# Patient Record
Sex: Female | Born: 1975
Health system: Southern US, Community
[De-identification: ages and names within clinical notes are randomized; demographics above are authoritative.]

## PROBLEM LIST (undated history)

## (undated) DIAGNOSIS — K469 Unspecified abdominal hernia without obstruction or gangrene: Secondary | ICD-10-CM

## (undated) DIAGNOSIS — I10 Essential (primary) hypertension: Secondary | ICD-10-CM

## (undated) HISTORY — PX: HERNIA REPAIR: SHX51

---

## 2000-12-04 ENCOUNTER — Encounter: Payer: Self-pay | Admitting: *Deleted

## 2000-12-04 ENCOUNTER — Ambulatory Visit (HOSPITAL_COMMUNITY): Admission: RE | Admit: 2000-12-04 | Discharge: 2000-12-04 | Payer: Self-pay | Admitting: *Deleted

## 2001-05-16 ENCOUNTER — Ambulatory Visit (HOSPITAL_COMMUNITY): Admission: RE | Admit: 2001-05-16 | Discharge: 2001-05-16 | Payer: Self-pay | Admitting: *Deleted

## 2001-05-16 ENCOUNTER — Encounter: Payer: Self-pay | Admitting: *Deleted

## 2002-06-04 ENCOUNTER — Other Ambulatory Visit: Admission: RE | Admit: 2002-06-04 | Discharge: 2002-06-04 | Payer: Self-pay | Admitting: Family Medicine

## 2003-10-27 ENCOUNTER — Ambulatory Visit: Payer: Self-pay | Admitting: Family Medicine

## 2004-10-17 ENCOUNTER — Ambulatory Visit: Payer: Self-pay | Admitting: Family Medicine

## 2005-04-17 ENCOUNTER — Ambulatory Visit: Payer: Self-pay | Admitting: Family Medicine

## 2005-06-07 ENCOUNTER — Emergency Department (HOSPITAL_COMMUNITY): Admission: EM | Admit: 2005-06-07 | Discharge: 2005-06-07 | Payer: Self-pay | Admitting: *Deleted

## 2005-10-09 ENCOUNTER — Encounter: Admission: RE | Admit: 2005-10-09 | Discharge: 2005-11-21 | Payer: Self-pay | Admitting: Obstetrics and Gynecology

## 2005-12-05 ENCOUNTER — Inpatient Hospital Stay (HOSPITAL_COMMUNITY): Admission: AD | Admit: 2005-12-05 | Discharge: 2005-12-08 | Payer: Self-pay | Admitting: *Deleted

## 2006-10-15 DIAGNOSIS — Z9889 Other specified postprocedural states: Secondary | ICD-10-CM

## 2006-10-15 DIAGNOSIS — A6000 Herpesviral infection of urogenital system, unspecified: Secondary | ICD-10-CM | POA: Insufficient documentation

## 2006-11-21 ENCOUNTER — Emergency Department (HOSPITAL_COMMUNITY): Admission: EM | Admit: 2006-11-21 | Discharge: 2006-11-21 | Payer: Self-pay | Admitting: Family Medicine

## 2007-02-17 ENCOUNTER — Emergency Department (HOSPITAL_COMMUNITY): Admission: EM | Admit: 2007-02-17 | Discharge: 2007-02-17 | Payer: Self-pay | Admitting: Emergency Medicine

## 2007-11-27 ENCOUNTER — Encounter (INDEPENDENT_AMBULATORY_CARE_PROVIDER_SITE_OTHER): Payer: Self-pay | Admitting: *Deleted

## 2008-04-23 ENCOUNTER — Emergency Department (HOSPITAL_COMMUNITY): Admission: EM | Admit: 2008-04-23 | Discharge: 2008-04-24 | Payer: Self-pay | Admitting: Emergency Medicine

## 2008-10-08 ENCOUNTER — Telehealth (INDEPENDENT_AMBULATORY_CARE_PROVIDER_SITE_OTHER): Payer: Self-pay | Admitting: *Deleted

## 2009-10-27 ENCOUNTER — Ambulatory Visit: Payer: Self-pay | Admitting: Physician Assistant

## 2009-10-27 DIAGNOSIS — K429 Umbilical hernia without obstruction or gangrene: Secondary | ICD-10-CM | POA: Insufficient documentation

## 2009-11-24 ENCOUNTER — Ambulatory Visit: Payer: Self-pay | Admitting: Nurse Practitioner

## 2009-11-24 DIAGNOSIS — R350 Frequency of micturition: Secondary | ICD-10-CM

## 2009-11-24 LAB — CONVERTED CEMR LAB
Bilirubin Urine: NEGATIVE
Glucose, Urine, Semiquant: NEGATIVE
Ketones, urine, test strip: NEGATIVE
Nitrite: NEGATIVE
Protein, U semiquant: NEGATIVE
Specific Gravity, Urine: 1.02
Urobilinogen, UA: 0.2
WBC Urine, dipstick: NEGATIVE
pH: 7

## 2010-01-12 ENCOUNTER — Ambulatory Visit: Payer: Self-pay | Admitting: Nurse Practitioner

## 2010-03-01 NOTE — Assessment & Plan Note (Signed)
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 Is Patient Diabetic? No Pain Assessment Patient in pain? no       Does patient need assistance? Functional Status Self care Ambulation Normal   CC:  pt is here knot on naval....  History of Present Illness: Re-estab.  Previously seeing Dr. Barbaraann Barthel.  Has followed with Dr. Su Hilt at GYN clinic at [facility] over last 3 years.  Notes mass around navel over last several mos.  Has 2 children.  Thought it was from carrying youngest who is 3.  She notes mass can be pushed back in.  Does have some tenderness.  She is passing flatus.  Normal BMs.  No nausea or vomiting.    Habits & Providers  Alcohol-Tobacco-Diet     Tobacco Status: current  Exercise-Depression-Behavior     Drug Use: no  Current Medications (verified): 1)  None  Allergies (verified): No Known Drug Allergies  Past History:  Past Medical History: Current Problems:  HERPES, GENITAL NOS (ICD-054.10) ABORTION, BY DILATION AND EVACUATION, HX OF (ICD-V15.2) PREGNANCY, NORMAL (ICD-V22.2) h/o gestational diabetes  Past Surgical History: Reviewed history from 10/15/2006 and no changes required. 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 GU:  Denies dysuria.  Physical Exam  General:  alert, well-developed, and well-nourished.   Head:  normocephalic and atraumatic.     Neck:  supple.   Lungs:  normal breath sounds.   Heart:  normal rate and regular rhythm.   Abdomen:  soft, normal bowel sounds, no hepatomegaly, and no splenomegaly.   small periumbilical hernia noted; easily reduced; mod painful with palpation  Neurologic:  alert & oriented X3 and cranial nerves II-XII intact.   Psych:  normally interactive.     Impression & Recommendations:  Problem # 1:  PERIUMBILICAL HERNIA (ICD-553.1) offered referral to sugeon she prefers to hold off for now will monitor and let us know if she changes mind discussed signs to look out for with incarcerated hernias  Problem # 2:  Preventive Health Care (ICD-V70.0) schedule cpp  Patient Instructions: 1)  Schedule CPP in the next 2-3 months. 2)  Call if your hernia feels worse or you decide you want to see the surgeon.  Appended Document: Periumbilical hernia    Clinical Lists Changes  Orders: Added new Service order of New Patient Level III [id number]) - Signed

## 2010-03-01 NOTE — Assessment & Plan Note (Signed)
Summary:  UTI  ///  KT  Nurse Visit   Vital Signs:  Patient profile:   35 year old female Temp:     97.8 degrees F Pulse rate:   92 / minute Pulse rhythm:   regular Resp:     20 per minute BP sitting:   116 / 70  (right arm) Cuff size:   regular  Vitals Entered By: Dutch Quint RN (November 24, 2009 2:06 PM)  Patient Instructions: 1)  Reviewed with Melissa Moyer 2)  We will call you with the results of your urine culture. 3)  Don't use scented toilet paper or lotions, washes.  Don't douche.   4)  Use cotton panties, or at least cotton-lined panties and stockings. 5)  Drink plenty of water. 6)  When performing personal hygiene, make sure you wash/wipe from front to back. 7)  Call if you have any questions or if anything changes.   Impression & Recommendations:  Problem # 1:  URINARY FREQUENCY (ICD-788.41) c/o urinary discomfort, frequency, hesitancy Urine culture sent No tx at this time  Orders: UA Dipstick w/o Micro (automated)  (81003) T-Culture, Urine ([id number])  CC: Thinks she has a UTI Is Patient Diabetic? No Pain Assessment Patient in pain? yes     Location: right flank Intensity: 6 Type: cramping   CC:  Thinks she has a UTI.  History of Present Illness: Having lower back/right flank pain, thinks she has a UTI, also discomfort when she urinates.  Denies frequency, but having urgency, urinary retention.  Denies odor, no vaginal discharge, hematuria. Denies using scented products, new cleansing products. Has changed laundry detergents.   Review of Systems GU:  Complains of dysuria, nocturia, and urinary hesitancy.   Allergies: No Known Drug Allergies Laboratory Results   Urine Tests  Date/Time Received: November 24, 2009 2:26 PM   Routine Urinalysis   Color: yellow Glucose: negative   (Normal Range: Negative) Bilirubin: negative   (Normal Range: Negative) Ketone: negative   (Normal Range: Negative) Spec. Gravity: 1.020   (Normal Range:  1.003-1.035) Blood: trace-intact   (Normal Range: Negative) pH: 7.0   (Normal Range: 5.0-8.0) Protein: negative   (Normal Range: Negative) Urobilinogen: 0.2   (Normal Range: 0-1) Nitrite: negative   (Normal Range: Negative) Leukocyte Esterace: negative   (Normal Range: Negative)       Orders Added: 1)  Est. Patient Level I [40102] 2)  UA Dipstick w/o Micro (automated)  [81003] 3)  T-Culture, Urine [[id number]]  Prevention & Chronic Care Immunizations   Influenza vaccine: Not documented   Influenza vaccine deferral: Refused  (11/24/2009)    Tetanus booster: Not documented    Pneumococcal vaccine: Not documented  Other Screening   Pap smear: Not documented   Smoking status: current  (10/27/2009)  Lipids   Total Cholesterol: Not documented   LDL: Not documented   LDL Direct: Not documented   HDL: Not documented   Triglycerides: Not documented

## 2010-04-22 ENCOUNTER — Encounter: Payer: Self-pay | Admitting: Nurse Practitioner

## 2010-04-22 ENCOUNTER — Other Ambulatory Visit: Payer: Self-pay | Admitting: Nurse Practitioner

## 2010-04-22 ENCOUNTER — Other Ambulatory Visit (HOSPITAL_COMMUNITY)
Admission: RE | Admit: 2010-04-22 | Discharge: 2010-04-22 | Disposition: A | Discharge status: Home / Self Care | Payer: Medicaid Other | Source: Ambulatory Visit | Attending: Internal Medicine | Admitting: Internal Medicine

## 2010-04-22 ENCOUNTER — Encounter (INDEPENDENT_AMBULATORY_CARE_PROVIDER_SITE_OTHER): Payer: Self-pay | Admitting: Nurse Practitioner

## 2010-04-22 DIAGNOSIS — N898 Other specified noninflammatory disorders of vagina: Secondary | ICD-10-CM | POA: Insufficient documentation

## 2010-04-22 DIAGNOSIS — Z01419 Encounter for gynecological examination (general) (routine) without abnormal findings: Secondary | ICD-10-CM | POA: Insufficient documentation

## 2010-04-22 DIAGNOSIS — F17200 Nicotine dependence, unspecified, uncomplicated: Secondary | ICD-10-CM | POA: Insufficient documentation

## 2010-04-22 DIAGNOSIS — Z8742 Personal history of other diseases of the female genital tract: Secondary | ICD-10-CM | POA: Insufficient documentation

## 2010-04-22 DIAGNOSIS — R3129 Other microscopic hematuria: Secondary | ICD-10-CM | POA: Insufficient documentation

## 2010-04-22 DIAGNOSIS — A599 Trichomoniasis, unspecified: Secondary | ICD-10-CM | POA: Insufficient documentation

## 2010-04-22 DIAGNOSIS — D649 Anemia, unspecified: Secondary | ICD-10-CM | POA: Insufficient documentation

## 2010-04-22 LAB — CONVERTED CEMR LAB
ALT: 12 units/L (ref 0–35)
AST: 15 units/L (ref 0–37)
Albumin: 4.2 g/dL (ref 3.5–5.2)
Alkaline Phosphatase: 52 units/L (ref 39–117)
BUN: 14 mg/dL (ref 6–23)
Basophils Absolute: 0.1 10*3/uL (ref 0.0–0.1)
Basophils Relative: 1 % (ref 0–1)
Bilirubin Urine: NEGATIVE
CO2: 25 meq/L (ref 19–32)
Calcium: 9.4 mg/dL (ref 8.4–10.5)
Chlamydia, DNA Probe: NEGATIVE
Chloride: 105 meq/L (ref 96–112)
Creatinine, Ser: 0.79 mg/dL (ref 0.40–1.20)
Eosinophils Absolute: 0.3 10*3/uL (ref 0.0–0.7)
Eosinophils Relative: 3 % (ref 0–5)
GC Probe Amp, Genital: NEGATIVE
Glucose, Bld: 80 mg/dL (ref 70–99)
Glucose, Urine, Semiquant: NEGATIVE
HCT: 36.1 % (ref 36.0–46.0)
Hemoglobin: 12 g/dL (ref 12.0–15.0)
KOH Prep: NEGATIVE
Ketones, urine, test strip: NEGATIVE
Lymphocytes Relative: 34 % (ref 12–46)
Lymphs Abs: 3.1 10*3/uL (ref 0.7–4.0)
MCHC: 33.2 g/dL (ref 30.0–36.0)
MCV: 94.3 fL (ref 78.0–100.0)
Monocytes Absolute: 0.7 10*3/uL (ref 0.1–1.0)
Monocytes Relative: 7 % (ref 3–12)
Neutro Abs: 5 10*3/uL (ref 1.7–7.7)
Neutrophils Relative %: 55 % (ref 43–77)
Nitrite: NEGATIVE
Platelets: 272 10*3/uL (ref 150–400)
Potassium: 3.9 meq/L (ref 3.5–5.3)
Protein, U semiquant: NEGATIVE
RBC: 3.83 M/uL — ABNORMAL LOW (ref 3.87–5.11)
RDW: 15.8 % — ABNORMAL HIGH (ref 11.5–15.5)
Rapid HIV Screen: NEGATIVE
Sodium: 140 meq/L (ref 135–145)
Specific Gravity, Urine: 1.02
Total Bilirubin: 0.3 mg/dL (ref 0.3–1.2)
Total Protein: 6.9 g/dL (ref 6.0–8.3)
Urobilinogen, UA: 0.2
WBC Urine, dipstick: NEGATIVE
WBC: 9.2 10*3/uL (ref 4.0–10.5)
pH: 7

## 2010-04-22 LAB — CYTOLOGY - PAP

## 2010-04-25 ENCOUNTER — Encounter (INDEPENDENT_AMBULATORY_CARE_PROVIDER_SITE_OTHER): Payer: Self-pay | Admitting: Nurse Practitioner

## 2010-04-25 ENCOUNTER — Encounter (INDEPENDENT_AMBULATORY_CARE_PROVIDER_SITE_OTHER): Payer: Self-pay | Admitting: Internal Medicine

## 2010-04-25 ENCOUNTER — Telehealth (INDEPENDENT_AMBULATORY_CARE_PROVIDER_SITE_OTHER): Payer: Self-pay | Admitting: Nurse Practitioner

## 2010-04-27 ENCOUNTER — Encounter (INDEPENDENT_AMBULATORY_CARE_PROVIDER_SITE_OTHER): Payer: Self-pay | Admitting: Nurse Practitioner

## 2010-04-28 NOTE — Assessment & Plan Note (Signed)
Summary: Complete Physical Exam   Vital Signs:  Patient profile:   35 year old female Menstrual status:  regular LMP:     03/31/2010 Weight:      168.9 pounds BMI:     26.55 Temp:     98.5 degrees F oral Pulse rate:   90 / minute Pulse rhythm:   regular Resp:     20 per minute BP sitting:   113 / 74  (left arm) Cuff size:   regular  Vitals Entered By: Levon Hedger (April 22, 2010 2:23 PM)  Nutrition Counseling: Patient's BMI is greater than 25 and therefore counseled on weight management options. CC: Cpp...hernia beside navel Is Patient Diabetic? No Pain Assessment Patient in pain? no       Does patient need assistance? Functional Status Self care Ambulation Normal LMP (date): 03/31/2010 LMP - Character: normal    Menses interval (days): 26 Menstrual flow (days): 5 Menstrual Status regular Enter LMP: 03/31/2010   CC:  Cpp...hernia beside navel.  History of Present Illness:  Pt into the office for a CPE  PAP - done last 3 years ago No family history of cervical or ovarian Menses - monthly 2 children - natural births No current birth control  Mammogram - never had a mammogram No self breast checks at home no family history of breast cancer  Optho - No current glasses or contacts. previous eye exam and no corrective lenses needed  Dental - last dental appt was 1 month ago  tdap - last in 2009 (pt recall)  Habits & Providers  Alcohol-Tobacco-Diet     Alcohol drinks/day: <1     Tobacco Status: current     Tobacco Counseling: to quit use of tobacco products     Cigarette Packs/Day: 0.5  Exercise-Depression-Behavior     Does Patient Exercise: no     Have you felt down or hopeless? no     Have you felt little pleasure in things? no     Depression Counseling: not indicated; screening negative for depression     Drug Use: no  Medications Prior to Update: 1)  None  Allergies (verified): No Known Drug Allergies  Social History: Packs/Day:   0.5 Does Patient Exercise:  no  Review of Systems General:  Denies fever. Eyes:  Denies blurring. ENT:  Denies earache. CV:  Denies chest pain or discomfort and fatigue. Resp:  Denies cough. GI:  Denies abdominal pain, nausea, and vomiting. GU:  Complains of urinary frequency; denies discharge. MS:  Denies joint pain. Derm:  Denies dryness. Neuro:  Denies headaches. Psych:  Denies anxiety and depression.  Physical Exam  General:  alert.   Head:  normocephalic.   Eyes:  pupils round.   Ears:  ear piercing(s) noted.   Nose:  no nasal discharge.   Mouth:  tongue ring Neck:  supple.   Chest Wall:  no mass.   Breasts:  skin/areolae normal.   Lungs:  normal breath sounds.   Heart:  normal rate and regular rhythm.   Abdomen:  normal bowel sounds.  small periumbilical hernia - reducible  Rectal:  no external abnormalities.    Pelvic Exam  Vulva:      normal appearance.   Urethra and Bladder:      Urethra--normal.  Bladder--normal.   Vagina:      malodorus, frothy discharge.   Cervix:      midposition.   Uterus:      smooth.   Adnexa:  nontender bilaterally.      Impression & Recommendations:  Problem # 1:  ROUTINE GYNECOLOGICAL EXAMINATION (ICD-V72.31) PAP done  rec optho and dental Orders: KOH/ WET Mount [phone]) Pap Smear, Thin Prep ( Collection of) (J8119)  Problem # 2:  URINARY FREQUENCY (ICD-788.41)  Orders: UA Dipstick w/o Micro (manual) (14782) T- GC Chlamydia (95621)  Problem # 3:  TOBACCO ABUSE (ICD-305.1) advised cessation  Problem # 4:  PERIUMBILICAL HERNIA (ICD-553.1) advised pt to monitor very small defect now - will refer if symptoms get worse  Problem # 5:  TRICHOMONIASIS (ICD-131.9) handout given to pt advised that her partner needs treatment  Complete Medication List: 1)  Metronidazole 500 Mg Tabs (Metronidazole) .... 4 tablets by mouth x 1 dose  Other Orders: T-CBC w/Diff ([id number]) T-Culture, Urine  ([id number]) T-Comprehensive Metabolic Panel ([id number])  Patient Instructions: 1)  You have an infection - read handout 2)  Take medication as ordered to make sure infection is cured. 3)  Be sure your partner is also treated. 4)  Follow up as needed Prescriptions: METRONIDAZOLE 500 MG TABS (METRONIDAZOLE) 4 tablets by mouth x 1 dose  #4 x 0   Entered and Authorized by:   Lehman Prom FNP   Signed by:   Lehman Prom FNP on 04/22/2010   Method used:   Print then Give to Patient   RxID:   [id number]    Orders Added: 1)  UA Dipstick w/o Micro (manual) [81002] 2)  Est. Patient age 65-39 [73] 3)  KOH/ WET Irish Lack [id number] 4)  Pap Smear, Thin Prep ( Collection of) [Q0091] 5)  T- GC Chlamydia [32951] 6)  T-CBC w/Diff [[id number]] 7)  T-Culture, Urine [[id number]] 8)  T-Comprehensive Metabolic Panel [[id number]]    Laboratory Results   Urine Tests  Date/Time Received: April 22, 2010 2:39 PM   Routine Urinalysis   Color: yellow Appearance: Clear Glucose: negative   (Normal Range: Negative) Bilirubin: negative   (Normal Range: Negative) Ketone: negative   (Normal Range: Negative) Spec. Gravity: 1.020   (Normal Range: 1.003-1.035) Blood: moderate   (Normal Range: Negative) pH: 7.0   (Normal Range: 5.0-8.0) Protein: negative   (Normal Range: Negative) Urobilinogen: 0.2   (Normal Range: 0-1) Nitrite: negative   (Normal Range: Negative) Leukocyte Esterace: negative   (Normal Range: Negative)    Date/Time Received: April 22, 2010 3:40 PM   Wet Mount/KOH Source: vaginal  WBC/hpf: 1-5 Bacteria/hpf: rare Clue cells/hpf: none Yeast/hpf: none Trichomonas/hpf: few  Other Tests  Rapid HIV: negative    Laboratory Results   Urine Tests    Routine Urinalysis   Color: yellow Appearance: Clear Glucose: negative   (Normal Range: Negative) Bilirubin: negative   (Normal Range: Negative) Ketone: negative   (Normal Range: Negative) Spec. Gravity: 1.020    (Normal Range: 1.003-1.035) Blood: moderate   (Normal Range: Negative) pH: 7.0   (Normal Range: 5.0-8.0) Protein: negative   (Normal Range: Negative) Urobilinogen: 0.2   (Normal Range: 0-1) Nitrite: negative   (Normal Range: Negative) Leukocyte Esterace: negative   (Normal Range: Negative)      Wet Mount Wet Mount KOH: Negative  Other Tests  Rapid HIV: negative

## 2010-04-28 NOTE — Letter (Signed)
Summary: Handout Printed  Printed Handout:  - Trichomonas 

## 2010-05-03 NOTE — Progress Notes (Signed)
Summary: Surgery Referral update  Phone Note Call from Patient Call back at Home Phone [phone]   Reason for Call: Referral Summary of Call: PT WANTS HERNIA REFERRAL AFTER ALL.  PLS CALL WITH INFO. Initial call taken by: Ayesha Rumpf,  April 25, 2010 8:23 AM  Follow-up for Phone Call        reviewed with pt during exam that her periumbilical hernia is small and reducible. There is likely not going to be anything surgically needed at this time as area is reducible, nontender.  But that she should avoid straining and lifting. Pt does have medicaid - will order surgery referral as per her request but advise pt this is just for consult. Going to the consult does NOT mean that she is going to have surgery. Follow-up by: Lehman Prom FNP,  April 25, 2010 9:32 AM  Additional Follow-up for Phone Call Additional follow up Details #1::        Pt is sched for 05-10-10 @ 2:50pm with Dr.Byerly @ [facility]. Additional Follow-up by: Candi Leash,  April 28, 2010 12:26 PM

## 2010-05-03 NOTE — Letter (Signed)
Summary: SURGICAL REFERRAL  SURGICAL REFERRAL   Imported By: Arta Bruce 04/28/2010 15:12:20  _____________________________________________________________________  External Attachment:    Type:   Image     Comment:   External Document

## 2010-05-03 NOTE — Letter (Signed)
Summary: *HSN Results Follow up  [facility]  [address] [place], [place] [address]   Phone: [phone]  Fax: [phone]      04/25/2010   Melissa Moyer [address] [place], [place]  [address]   Dear  Ms. Melissa Moyer,                            ____S.Drinkard,FNP   ____D. Gore,FNP       ____B. McPherson,MD   ____V. Rankins,MD    ____E. Mulberry,MD    _X___N. Daphine Deutscher, FNP  ____D. Reche Dixon, MD    ____K. Philipp Deputy, MD    ____Other     This letter is to inform you that your recent test(s):  ___X____Pap Smear    ___X____Lab Test     _______X-ray    ___X____ is within acceptable limits  _______ requires a medication change  _______ requires a follow-up lab visit  _______ requires a follow-up visit with your provider   Comments: Labs done during recent office visit are normal.  Pap Smear results _____________________.       _________________________________________________________ If you have any questions, please contact our office [phone].                    Sincerely,   Lehman Prom FNP [facility]

## 2010-05-12 LAB — D-DIMER, QUANTITATIVE: D-Dimer, Quant: 0.34 ug/mL-FEU (ref 0.00–0.48)

## 2010-06-17 NOTE — H&P (Signed)
Melissa Moyer NO.:  [id number]   MEDICAL RECORD NO.:  [id number]          PATIENT TYPE:  INP   LOCATION:  9134                          FACILITY:  [facility]   PHYSICIAN:  Osborn Coho, M.D.   DATE OF BIRTH:  Apr 30, 1975   DATE OF ADMISSION:  12/05/2005  DATE OF DISCHARGE:                                HISTORY & PHYSICAL   Melissa Moyer is a 35 year old gravida 3, para 1-0-1-1 who was admitted at 38  weeks for induction of labor secondary to gestational diabetes, on insulin  and non-reactive NSTs with a favorable cervix.  The patient was evaluated by  Dr. Pennie Rushing in the office prior to admission today and was found to be 2-3  cm dilated, 70% effaced, and 0 station.  The patient with nonreactive NST at  the office today. The patient denies leakage of fluids, bleeding, or  contractions at the present time.  The patient reports that she has noted  some increased vaginal discharge with no itch or odor.  The patient reports  that her fetus has been moving normally.  The patient denies any HSV signs  and symptoms or prodromal signs and symptoms.  The patient reports her last  HSV outbreak was approximately 1 year ago.  The patient denies headache,  vision changes, or right upper quadrant pain.  The patient's pregnancy is  remarkable for:  1. Gestational diabetes on insulin.  2. History of HSV-2.  3. History of asthma.  4. Conceived on OCPs.   PRENATAL LABS:  Initial hemoglobin 11.7, hematocrit 33.6, platelets 269,000,  blood type A-positive.  Antibody screen is negative.  Sickle cell trait is  negative.  RPR nonreactive.  Rubella titer immune.  Hepatitis B surface  antigen negative.  HIV nonreactive.  Pap smear within normal limits.  Gonorrhea and Chlamydia cultures are negative x2.  Cystic fibrosis screening  was declined.  First trimester screen declined.  Quad screen declined.  Hemoglobin at 27 weeks 9.7.  One-hour Glucola at 27 weeks 138.  Three-hour  Glucola  results:  Fasting 107, one hour 188,  two hours 141, three hours  148.  Group B strep is negative.   HISTORY OF PRESENT PREGNANCY:  The patient entered care at 65 weeks'  gestation.  The patient's [facility], December 19, 2005, established by an  ultrasound at 13 weeks.  The patient's last menstrual period March 09, 2005.  The patient's pregnancy initially followed by the Melissa Moyer service at  [facility] but the patient transferred to MD service with the  diagnosis of gestational diabetes.  The patient declined first trimester  screen.  The patient underwent a repeat ultrasound at 18 weeks, revealed  normal anatomy, a cervix 3.69 cm, anterior placenta with no previa with  intermittent complaints of back pain which resolved spontaneously between 15  and 18 weeks.  One-hour Glucola was obtained at 27 weeks.  The patient was  started on b.i.d. iron supplements at 27 weeks secondary to hemoglobin of  9.7.  The patient was referred to [facility] for  diabetic education at 43  weeks.  At 31 weeks' the patient reported fasting  blood sugars 87-100 and 2-hour postprandial of 76-138.  Ultrasound was  repeated at 32 weeks secondary to size less than dates which revealed  estimated fetal weight at the 67th to [facility] with normal amniotic  fluid.  At 32 weeks fasting blood sugars were all less than 95 and 2-hour  postprandials 91-125.  Nonstress test at 32 and 6, was nonreactive and the  patient underwent biophysical profile which was 8 out of 10.  Amniotic fluid  index was normal at that time.  Fasting blood sugars 83-96 and 2-hour  postprandials 94-148.  At that time, the patient was counseled to increase  exercise and be compliant with diet; however, if not, if her blood sugars  were not decreased, then she would be started on insulin.  At 33 weeks and 6  days, fasting blood sugars 91-103 and 2-hour postprandials 81-140.  The  patient was started on 6 units of NPH  insulin at that time.  The patient  continues on biweekly NSTs.  The patient began Valtrex prophylaxis at 35  weeks' gestation.  The patient continues with biweekly NSTs until the day of  admission.   OB HISTORY:  Pregnancy #1, August 1992, spontaneous vaginal delivery, female  infant, 7 pounds 5-1/2 ounces at 33 weeks' gestation, 5-hour labor with no  complications.  Pregnancy #2, February 1997, spontaneous AB at 9 weeks, no  complications.  Pregnancy #3, present.   GYN HISTORY:  The patient reports the use of Ortho-Tri-Cyclen which she  discontinued in February 2007.  The patient with a history of abnormal Pap  in the past, however, none recent.  The patient with a history of HSV and  takes Valtrex on a daily basis prior to pregnancy.  The patient denies other  history of STDs.  The patient reports menarche at age 71 with regular  monthly menses.   PAST MEDICAL HISTORY:  History with asthma.  However, the patient has not  used any medications for the past 3-4 years.   PAST SURGICAL HISTORY:  Negative.  Hospitalized only for childbirth.   ALLERGIES:  No known drug allergies.   CURRENT MEDICATIONS:  1. Valtrex 500 mg p.o. daily.  2. Prenatal vitamins 1 tablet daily.   FAMILY HISTORY:  Mother with chronic hypertension, anemia, tobacco use.  Paternal grandfather CVA, colon cancer.   GENETIC HISTORY:  The patient's nephew with sickle cell trait, otherwise  negative.   SOCIAL HISTORY:  The patient is single.  Father of the baby, [name] is involved and supportive.  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  Father of the baby is an Economist and has an associate's  degree education.  The patient denies the use of tobacco, alcohol, or street  drugs at the present.  However, the patient with a history of tobacco use in the past.  The patient's domestic violence screen is negative.    REVIEW OF SYSTEMS:  __________ of one at-term pregnancy.   PHYSICAL EXAM:  The patient is afebrile.  Vital signs are stable.  HEENT:  Within normal limits.  LUNGS:  Clear.  HEART:  Regular rate and rhythm.  BREASTS:  Soft.  ABDOMEN:  Soft, gravid, and nontender.  Fetus vertex to Leopold's maneuvers.  CBG on admission 162 and the patient ate 2 hours and 45 minutes prior to  that value being obtained.  Fetal  heart rate in the 160s baseline with  accelerations noted to be present and variability present.  No decelerations  are noted.  Toco with irregular uterine contractions are noted.  Speculum  exam of the cervix reveals no HSV lesions vision on the external genitalia,  cervix, or vagina.  Sterile vaginal exam, per Dr. Su Hilt, 2-3 cm, 70%  effaced, -2 station, and attempted AROM which was unsuccessful.  EXTREMITIES:  Negative edema.  Negative Homans bilaterally.   ASSESSMENT:  1. Intrauterine pregnancy at 80 weeks' gestation.  2. Gestational diabetes, poorly controlled.  3. Nonreactive antepartal testing.  4. Induction of labor secondary to #2 and #3.   PLAN:  1. The patient to be admitted to birthing suite for a consult with Dr.      Su Hilt.  2. Routine MD orders.  3. The patient will be started on low-dose Pitocin per protocol.  The      patient will have CBGs obtained every 2 hours and epidural p.r.n.      Melissa Moyer, Melissa Moyer      Osborn Coho, M.D.  Electronically Signed    NOS/MEDQ  D:  12/05/2005  T:  12/06/2005  Job:  [id number]

## 2010-10-20 LAB — POCT URINALYSIS DIP (DEVICE)
Ketones, ur: NEGATIVE
Operator id: 247071
Protein, ur: 100 — AB
Urobilinogen, UA: 1
pH: 7

## 2010-11-09 LAB — POCT URINALYSIS DIP (DEVICE)
Ketones, ur: NEGATIVE
Operator id: 239071
Protein, ur: NEGATIVE
Specific Gravity, Urine: 1.02
Urobilinogen, UA: 0.2
pH: 7

## 2011-03-09 ENCOUNTER — Encounter (HOSPITAL_COMMUNITY): Payer: Self-pay | Admitting: Emergency Medicine

## 2011-03-09 ENCOUNTER — Emergency Department (INDEPENDENT_AMBULATORY_CARE_PROVIDER_SITE_OTHER)
Admission: EM | Admit: 2011-03-09 | Discharge: 2011-03-09 | Disposition: A | Discharge status: Home / Self Care | Payer: Medicaid Other | Source: Home / Self Care

## 2011-03-09 DIAGNOSIS — B86 Scabies: Secondary | ICD-10-CM

## 2011-03-09 MED ORDER — PERMETHRIN 5 % EX CREA: TOPICAL_CREAM | CUTANEOUS | Status: AC

## 2011-03-09 NOTE — ED Notes (Signed)
[facility] [address]

## 2011-03-09 NOTE — ED Notes (Signed)
Child being treated as well today

## 2011-03-09 NOTE — ED Provider Notes (Signed)
History     CSN: [id number]  Arrival date & time 03/09/11  0910   None     Chief Complaint  Patient presents with  . Rash    (Consider location/radiation/quality/duration/timing/severity/associated sxs/prior treatment) HPI Comments: Patient states that she and children have had an itchy rash for at least 4 months. She was been using otc products and Vitamin E. As some bumps go new ones come. She initially thought the rash may be due to laundry detergents or soaps.  Her nephew had the same rash and was diagnosed yesterday with scabies.    Past Medical History  Diagnosis Date  . Asthma     History reviewed. No pertinent past surgical history.  History reviewed. No pertinent family history.  History  Substance Use Topics  . Smoking status: Current Everyday Smoker  . Smokeless tobacco: Not on file  . Alcohol Use: No    OB History    Grav Para Term Preterm Abortions TAB SAB Ect Mult Living                  Review of Systems  Constitutional: Negative for fever and chills.  HENT: Negative for congestion and sneezing.   Respiratory: Negative for cough.     Allergies  Fish allergy  Home Medications   Current Outpatient Rx  Name Route Sig Dispense Refill  . OVER THE COUNTER MEDICATION  Vitamin e blue star ointment    . PERMETHRIN 5 % EX CREA  Apply at bedtime for 8-12 hrs as directed 60 g 0    BP 107/70  Pulse 85  Temp(Src) 98.5 F (36.9 C) (Oral)  Resp 20  SpO2 100%  LMP 03/03/2011  Physical Exam  Nursing note and vitals reviewed. Constitutional: She appears well-developed and well-nourished. No distress.  HENT:  Head: Normocephalic and atraumatic.  Musculoskeletal: Normal range of motion. She exhibits no edema and no tenderness.  Skin: Skin is warm and dry. Rash noted.  Psychiatric: She has a normal mood and affect.    ED Course  Procedures (including critical care time)  Labs Reviewed - No data to display No results found.   1. Scabies        MDM          Melody Comas, PA 03/09/11 1115

## 2011-03-09 NOTE — ED Notes (Signed)
Patient reports concern for scabies.  Reports a nephew had the same rash and diagnosed with scabies.  Reports 4 month history of the same.  Patient reports bumps "come and go " and itch

## 2011-03-11 NOTE — ED Provider Notes (Signed)
Medical screening examination/treatment/procedure(s) were performed by non-physician practitioner and as supervising physician I was immediately available for consultation/collaboration.  Luiz Blare MD   Luiz Blare, MD 03/11/11 2133

## 2012-04-24 ENCOUNTER — Emergency Department (HOSPITAL_COMMUNITY)
Admission: EM | Admit: 2012-04-24 | Discharge: 2012-04-24 | Disposition: A | Discharge status: Home / Self Care | Payer: Medicaid Other | Source: Home / Self Care

## 2012-08-20 ENCOUNTER — Encounter (HOSPITAL_COMMUNITY): Payer: Self-pay | Admitting: Emergency Medicine

## 2012-08-20 ENCOUNTER — Emergency Department (INDEPENDENT_AMBULATORY_CARE_PROVIDER_SITE_OTHER)
Admission: EM | Admit: 2012-08-20 | Discharge: 2012-08-20 | Disposition: A | Discharge status: Home / Self Care | Payer: Medicaid Other | Source: Home / Self Care | Attending: Emergency Medicine | Admitting: Emergency Medicine

## 2012-08-20 DIAGNOSIS — R319 Hematuria, unspecified: Secondary | ICD-10-CM

## 2012-08-20 DIAGNOSIS — R1013 Epigastric pain: Secondary | ICD-10-CM

## 2012-08-20 HISTORY — DX: Unspecified abdominal hernia without obstruction or gangrene: K46.9

## 2012-08-20 LAB — COMPREHENSIVE METABOLIC PANEL
AST: 15 U/L (ref 0–37)
Albumin: 3.7 g/dL (ref 3.5–5.2)
BUN: 9 mg/dL (ref 6–23)
Calcium: 9.6 mg/dL (ref 8.4–10.5)
Creatinine, Ser: 0.88 mg/dL (ref 0.50–1.10)
Total Bilirubin: 0.2 mg/dL — ABNORMAL LOW (ref 0.3–1.2)
Total Protein: 6.9 g/dL (ref 6.0–8.3)

## 2012-08-20 LAB — FERRITIN: Ferritin: 8 ng/mL — ABNORMAL LOW (ref 10–291)

## 2012-08-20 LAB — POCT URINALYSIS DIP (DEVICE)
Ketones, ur: NEGATIVE mg/dL
Leukocytes, UA: NEGATIVE
Nitrite: NEGATIVE
Protein, ur: NEGATIVE mg/dL
Urobilinogen, UA: 0.2 mg/dL (ref 0.0–1.0)
pH: 6.5 (ref 5.0–8.0)

## 2012-08-20 LAB — IRON AND TIBC
Iron: 43 ug/dL (ref 42–135)
Saturation Ratios: 12 % — ABNORMAL LOW (ref 20–55)
TIBC: 361 ug/dL (ref 250–470)

## 2012-08-20 LAB — CBC WITH DIFFERENTIAL/PLATELET
Basophils Absolute: 0 10*3/uL (ref 0.0–0.1)
Basophils Relative: 0 % (ref 0–1)
Eosinophils Relative: 4 % (ref 0–5)
HCT: 36.4 % (ref 36.0–46.0)
Lymphocytes Relative: 28 % (ref 12–46)
MCHC: 34.1 g/dL (ref 30.0–36.0)
MCV: 93.6 fL (ref 78.0–100.0)
Monocytes Absolute: 0.5 10*3/uL (ref 0.1–1.0)
Neutro Abs: 5.3 10*3/uL (ref 1.7–7.7)
Platelets: 261 10*3/uL (ref 150–400)
RDW: 14.9 % (ref 11.5–15.5)
WBC: 8.5 10*3/uL (ref 4.0–10.5)

## 2012-08-20 LAB — POCT PREGNANCY, URINE: Preg Test, Ur: NEGATIVE

## 2012-08-20 MED ORDER — OMEPRAZOLE 40 MG PO CPDR: 40.0000 mg | DELAYED_RELEASE_CAPSULE | Freq: Every day | ORAL | Status: DC

## 2012-08-20 NOTE — ED Provider Notes (Signed)
History    CSN: [id number] Arrival date & time 08/20/12  4098  First MD Initiated Contact with Patient 08/20/12 [phone]     Chief Complaint  Patient presents with  . Abdominal Pain   (Consider location/radiation/quality/duration/timing/severity/associated sxs/prior Treatment) HPI Comments: Patient presents urgent care describing for the last 2-3 days she's been having a burning sensation and discomfort in her stomach area (patient points to epigastric region), she's also somewhat concerned that she has this metallic taste in her mouth and she has been doing some research where she found out that she might be " iron overload". She's describes it in the past she was told that she had low iron levels and occasionally she takes multivitamins that contain iron.  He has also been feeling somewhat tired fatigued and not her usual self and believes that she needs to be detoxed and she has been using some dietary supplements and use niacin  Patient denies any abdominal pain at rest, denies any urinary symptoms, diarrheas or changes in bowel habits. Patient also denies any fevers or unintentional weight loss.  Patient is a 37 y.o. female presenting with abdominal pain.  Abdominal Pain Associated symptoms include abdominal pain.   Past Medical History  Diagnosis Date  . Asthma   . Hernia    History reviewed. No pertinent past surgical history. History reviewed. No pertinent family history. History  Substance Use Topics  . Smoking status: Current Every Day Smoker  . Smokeless tobacco: Not on file  . Alcohol Use: Yes   OB History   Grav Para Term Preterm Abortions TAB SAB Ect Mult Living                 Review of Systems  Constitutional: Positive for fatigue. Negative for fever, diaphoresis, activity change, appetite change and unexpected weight change.  Eyes: Negative for photophobia.  Gastrointestinal: Positive for abdominal pain. Negative for nausea, vomiting, diarrhea and rectal  pain.  Musculoskeletal: Negative for myalgias, back pain, joint swelling, arthralgias and gait problem.  Skin: Negative for color change and rash.    Allergies  Fish allergy  Home Medications   Current Outpatient Rx  Name  Route  Sig  Dispense  Refill  . VALACYCLOVIR HCL PO   Oral   Take by mouth.         Marland Kitchen omeprazole (PRILOSEC) 40 MG capsule   Oral   Take 1 capsule (40 mg total) by mouth daily.   30 capsule   0   . OVER THE COUNTER MEDICATION      Vitamin e blue star ointment          BP 122/79  Pulse 84  Temp(Src) 98.5 F (36.9 C) (Oral)  Resp 18  SpO2 100%  LMP 08/11/2012 Physical Exam  Nursing note and vitals reviewed. Constitutional: Vital signs are normal. She appears well-developed and well-nourished.  Non-toxic appearance. She does not have a sickly appearance. She does not appear ill. No distress.  HENT:  Head: Normocephalic.  Right Ear: Tympanic membrane normal.  Left Ear: Tympanic membrane normal.  Mouth/Throat: Uvula is midline. No oropharyngeal exudate, posterior oropharyngeal edema, posterior oropharyngeal erythema or tonsillar abscesses.  Eyes: No scleral icterus.  Neck: Neck supple. No JVD present.  Abdominal: Soft. She exhibits no distension and no mass. There is no hepatosplenomegaly, splenomegaly or hepatomegaly. There is no tenderness. There is no rebound, no guarding and no CVA tenderness. No hernia. Hernia confirmed negative in the ventral area.  Neurological: She is alert.  Skin: No rash noted. No erythema.    ED Course  Procedures (including critical care time) Labs Reviewed  COMPREHENSIVE METABOLIC PANEL - Abnormal; Notable for the following:    Potassium 3.4 (*)    Total Bilirubin 0.2 (*)    GFR calc non Af Amer 82 (*)    All other components within normal limits  POCT URINALYSIS DIP (DEVICE) - Abnormal; Notable for the following:    Hgb urine dipstick MODERATE (*)    All other components within normal limits  CBC WITH  DIFFERENTIAL  IRON AND TIBC  FERRITIN  POCT PREGNANCY, URINE   No results found. 1. Epigastric abdominal pain   2. Hematuria     MDM  Problem #1 Nonspecific symptomatology- Patient looks well at a unremarkable abdominal exam.  Attending a comprehensive metabolic panel. Have advised patient to discontinue the use of niacin and we'll sedate further truly she has anemia or iron deficiency. Patient agrees. She will also followup with a local primary care Dr. written and verbal information was provided to patient.  Problem #2 epigastric discomfort, Reproducible most likely related to gastritis we'll start patient on a course of Prilosec for the next 2 to four-week has been encouraged as well to followup with her primary care Dr. for further evaluation if her symptoms persist. Patient agrees and understands and will followup.   Problem #3 hematuria Patient has been diagnosed with hematuria in the past that she reported that her workup was negative.Have advised her to followup with her primary care doctor about this as I have no details of her previous workup. Patient has no stigmata to suggest an oncological process such as renal carcinoma but have encouraged patient to followup on this as well.    Jimmie Molly, MD 08/20/12 [phone]

## 2012-08-20 NOTE — ED Notes (Signed)
Right epigastric pain for 2-3 days.  Reports nausea, no vomiting, no diarrhea.  Patient belching, but not related to change in pain.  C/o metal tastes in mouth.  Patient has been researching symptoms on line: concerned for "iron overload" .  Patient "detox" using [facility] [place] dietary supplement - niacin.

## 2012-08-20 NOTE — ED Notes (Signed)
Labs being drawn 

## 2012-08-22 ENCOUNTER — Telehealth (HOSPITAL_COMMUNITY): Payer: Self-pay | Admitting: *Deleted

## 2012-08-22 NOTE — ED Notes (Signed)
I called pt. Pt. verified x 2 and given results.  Pt. instructed to f/u with her PCP if symptoms persist, for further evaluation, as requested by Dr. Ladon Applebaum.  Pt. voiced understanding

## 2013-08-08 ENCOUNTER — Emergency Department (INDEPENDENT_AMBULATORY_CARE_PROVIDER_SITE_OTHER)
Admission: EM | Admit: 2013-08-08 | Discharge: 2013-08-08 | Disposition: A | Discharge status: Home / Self Care | Payer: Medicaid Other | Source: Home / Self Care | Attending: Family Medicine | Admitting: Family Medicine

## 2013-08-08 ENCOUNTER — Other Ambulatory Visit (HOSPITAL_COMMUNITY)
Admission: RE | Admit: 2013-08-08 | Discharge: 2013-08-08 | Disposition: A | Discharge status: Home / Self Care | Payer: Medicaid Other | Source: Ambulatory Visit | Attending: Family Medicine | Admitting: Family Medicine

## 2013-08-08 ENCOUNTER — Encounter (HOSPITAL_COMMUNITY): Payer: Self-pay | Admitting: Emergency Medicine

## 2013-08-08 DIAGNOSIS — Z113 Encounter for screening for infections with a predominantly sexual mode of transmission: Secondary | ICD-10-CM | POA: Insufficient documentation

## 2013-08-08 DIAGNOSIS — N760 Acute vaginitis: Secondary | ICD-10-CM | POA: Insufficient documentation

## 2013-08-08 DIAGNOSIS — N921 Excessive and frequent menstruation with irregular cycle: Secondary | ICD-10-CM

## 2013-08-08 LAB — POCT PREGNANCY, URINE: Preg Test, Ur: NEGATIVE

## 2013-08-08 NOTE — ED Notes (Signed)
Pt c/o abn menstrual cycles  Reports she has been having a cycle every 14 days since the beginning of June Sx also include abd pain and urinary freq Alert w/no signs of acute distress.

## 2013-08-08 NOTE — Discharge Instructions (Signed)

## 2013-08-08 NOTE — ED Provider Notes (Signed)
CSN: [id number]     Arrival date & time 08/08/13  1216 History   None    Chief Complaint  Patient presents with  . Menstrual Problem   (Consider location/radiation/quality/duration/timing/severity/associated sxs/prior Treatment) HPI Comments: 38 year old female presents complaining of frequent periods. Her last normal cycle was June 18, and she has had 2 cycles since then which is very abnormal for her, she is usually very regular. The cycles in between that seems exactly normal, except for the fact that they should not have happened. She denies any and all other associated symptoms. She admits to possible STD as a cause of this. She denies any abdominal or pelvic pain. She called her primary care doctor but they were not going to be able to see her for one more month.   Past Medical History  Diagnosis Date  . Asthma   . Hernia    History reviewed. No pertinent past surgical history. No family history on file. History  Substance Use Topics  . Smoking status: Current Every Day Smoker  . Smokeless tobacco: Not on file  . Alcohol Use: Yes   OB History   Grav Para Term Preterm Abortions TAB SAB Ect Mult Living                 Review of Systems  Genitourinary: Positive for menstrual problem.  All other systems reviewed and are negative.   Allergies  Fish allergy  Home Medications   Prior to Admission medications   Medication Sig Start Date End Date Taking? Authorizing Provider  omeprazole (PRILOSEC) 40 MG capsule Take 1 capsule (40 mg total) by mouth daily. 08/20/12   Rosana Hoes, MD  OVER THE COUNTER MEDICATION Vitamin e blue star ointment    Historical Provider, MD  VALACYCLOVIR HCL PO Take by mouth.    Historical Provider, MD   BP 128/87  Pulse 76  Temp(Src) 98.7 F (37.1 C) (Oral)  Resp 12  SpO2 98%  LMP 08/08/2013 Physical Exam  Nursing note and vitals reviewed. Constitutional: She is oriented to person, place, and time. Vital signs are normal. She appears  well-developed and well-nourished. No distress.  HENT:  Head: Normocephalic and atraumatic.  Cardiovascular: Normal rate, regular rhythm and normal heart sounds.   Pulmonary/Chest: Effort normal and breath sounds normal. No respiratory distress.  Abdominal: Soft. She exhibits no mass. There is no tenderness. There is no rebound and no guarding.  Genitourinary: Vagina normal and uterus normal. No vaginal discharge found.  Neurological: She is alert and oriented to person, place, and time. She has normal strength. Coordination normal.  Skin: Skin is warm and dry. No rash noted. She is not diaphoretic.  Psychiatric: She has a normal mood and affect. Judgment normal.    ED Course  Procedures (including critical care time) Labs Review Labs Reviewed  POCT PREGNANCY, URINE  CERVICOVAGINAL ANCILLARY ONLY    Imaging Review No results found.   MDM   1. Metrorrhagia    Physical exam is normal, she is not on her period at this time. Swabs sent to check for STDs. Followup with primary care. Discussed need for endometrial biopsy with abnormal uterine bleeding at age 10, and need for primary care workup of this condition       Liam Graham, PA-C 08/08/13 1420

## 2013-08-10 NOTE — ED Provider Notes (Signed)
Medical screening examination/treatment/procedure(s) were performed by resident physician or non-physician practitioner and as supervising physician I was immediately available for consultation/collaboration.   Pauline Good MD.   Billy Fischer, MD 08/10/13 [phone]

## 2013-08-11 NOTE — ED Notes (Signed)
After verifying ID, discussed positive gardnerella findings. Will discuss w provider to determine treatment

## 2013-08-12 ENCOUNTER — Telehealth (HOSPITAL_COMMUNITY): Payer: Self-pay | Admitting: *Deleted

## 2013-08-12 MED ORDER — METRONIDAZOLE 500 MG PO TABS: 500.0000 mg | ORAL_TABLET | Freq: Two times a day (BID) | ORAL | Status: DC

## 2013-08-12 NOTE — ED Notes (Addendum)
GC/Chamydia neg., Affirm: Candida and Trich neg., Gardnerella pos.  Pt. called back about her Rx. for bacterial vaginosis.  She said it wasn't at her pharmacy.  Discussed with Dr. Juventino Slovak and he e-prescribed Flagyl to pt.'s pharmacy. Pt. notified Rx. has been sent.   Pt. instructed to no alcohol while taking this medication. Melissa Moyer 08/12/2013

## 2013-12-30 ENCOUNTER — Other Ambulatory Visit (INDEPENDENT_AMBULATORY_CARE_PROVIDER_SITE_OTHER): Payer: Self-pay | Admitting: Surgery

## 2014-01-22 ENCOUNTER — Telehealth (INDEPENDENT_AMBULATORY_CARE_PROVIDER_SITE_OTHER): Payer: Self-pay

## 2014-01-22 NOTE — Telephone Encounter (Signed)
Pt called to report pain level at 9 after taking 2 Hydrocodone.acet 5/325, and adding ibuprofen 600 mg. She had umb hernia repair yesterday.  She was advised she could use ice pack to surgical area and could use up to 800 mg ibuprofen three times a day.  Will get message to MD.  If he has further recommendations, we will let her know.  She verbalized understanding.  SL

## 2014-01-22 NOTE — Telephone Encounter (Signed)
This was a tiny, tiny hernia.  I think she has a very low pain threshold.  I she wants, have someone write her for percocet 5/325  1-2 po q 4 hours prn #40  Thank you

## 2014-01-22 NOTE — Telephone Encounter (Signed)
Dr Hassell Done wrote the prescription for the patient. Thanks

## 2014-06-24 ENCOUNTER — Encounter (HOSPITAL_COMMUNITY): Payer: Self-pay | Admitting: Emergency Medicine

## 2014-06-24 ENCOUNTER — Emergency Department (HOSPITAL_COMMUNITY)
Admission: EM | Admit: 2014-06-24 | Discharge: 2014-06-24 | Disposition: A | Discharge status: Home / Self Care | Payer: Medicaid Other | Source: Home / Self Care | Attending: Family Medicine | Admitting: Family Medicine

## 2014-06-24 DIAGNOSIS — N946 Dysmenorrhea, unspecified: Secondary | ICD-10-CM | POA: Diagnosis not present

## 2014-06-24 NOTE — ED Notes (Signed)
C/o menstrual cramping for three months States she had a cycle twice last month States she has hx of painful menstrual cramping but lately it has been worst  Tylenol and advil used as tx

## 2014-06-24 NOTE — ED Provider Notes (Signed)
Melissa Moyer is a 39 y.o. female who presents to Urgent Care today for painful menstrual cramping. Patient has been having painful menstrual cramping for the past 3 months. She begun a new menstrual period 2 days ago. She's been taking ibuprofen and Tylenol which helps. She denies any excessive bleeding lightheadedness or dizziness. No fevers or chills. She has not followed up with her primary care doctor for this issue yet.   Past Medical History  Diagnosis Date  . Asthma   . Hernia    History reviewed. No pertinent past surgical history. History  Substance Use Topics  . Smoking status: Current Every Day Smoker  . Smokeless tobacco: Not on file  . Alcohol Use: Yes   ROS as above Medications: No current facility-administered medications for this encounter.   Current Outpatient Prescriptions  Medication Sig Dispense Refill  . metroNIDAZOLE (FLAGYL) 500 MG tablet Take 1 tablet (500 mg total) by mouth 2 (two) times daily. 14 tablet 0  . omeprazole (PRILOSEC) 40 MG capsule Take 1 capsule (40 mg total) by mouth daily. 30 capsule 0  . OVER THE COUNTER MEDICATION Vitamin e blue star ointment    . VALACYCLOVIR HCL PO Take by mouth.     Allergies  Allergen Reactions  . Fish Allergy      Exam:  BP 114/77 mmHg  Pulse 86  Temp(Src) 98.6 F (37 C) (Oral)  Resp 18  SpO2 100%  LMP 06/22/2014 Gen: Well NAD HEENT: EOMI,  MMM Lungs: Normal work of breathing. CTABL Heart: RRR no MRG Abd: NABS, Soft. Nondistended, Nontender no masses palpated Exts: Brisk capillary refill, warm and well perfused.   No results found for this or any previous visit (from the past 24 hour(s)). No results found.  Assessment and Plan: 39 y.o. female with painful menstruation. Patient is a smoker therefore combination oral contraceptions are not necessarily safe. Recommend follow-up with OB/GYN. Continue Tylenol and ibuprofen.  Discussed warning signs or symptoms. Please see discharge instructions. Patient  expresses understanding.     Gregor Hams, MD 06/24/14 [phone]

## 2014-06-24 NOTE — Discharge Instructions (Signed)
Thank you for coming in today. Follow-up with primary care provider or OB/GYN. If your belly pain worsens, or you have high fever, bad vomiting, blood in your stool or black tarry stool go to the Emergency Room.    Dysmenorrhea Menstrual cramps (dysmenorrhea) are caused by the muscles of the uterus tightening (contracting) during a menstrual period. For some women, this discomfort is merely bothersome. For others, dysmenorrhea can be severe enough to interfere with everyday activities for a few days each month. Primary dysmenorrhea is menstrual cramps that last a couple of days when you start having menstrual periods or soon after. This often begins after a teenager starts having her period. As a woman gets older or has a baby, the cramps will usually lessen or disappear. Secondary dysmenorrhea begins later in life, lasts longer, and the pain may be stronger than primary dysmenorrhea. The pain may start before the period and last a few days after the period.  CAUSES  Dysmenorrhea is usually caused by an underlying problem, such as:  The tissue lining the uterus grows outside of the uterus in other areas of the body (endometriosis).  The endometrial tissue, which normally lines the uterus, is found in or grows into the muscular walls of the uterus (adenomyosis).  The pelvic blood vessels are engorged with blood just before the menstrual period (pelvic congestive syndrome).  Overgrowth of cells (polyps) in the lining of the uterus or cervix.  Falling down of the uterus (prolapse) because of loose or stretched ligaments.  Depression.  Bladder problems, infection, or inflammation.  Problems with the intestine, a tumor, or irritable bowel syndrome.  Cancer of the female organs or bladder.  A severely tipped uterus.  A very tight opening or closed cervix.  Noncancerous tumors of the uterus (fibroids).  Pelvic inflammatory disease (PID).  Pelvic scarring (adhesions) from a previous  surgery.  Ovarian cyst.  An intrauterine device (IUD) used for birth control. RISK FACTORS You may be at greater risk of dysmenorrhea if:  You are younger than age 42.  You started puberty early.  You have irregular or heavy bleeding.  You have never given birth.  You have a family history of this problem.  You are a smoker. SIGNS AND SYMPTOMS   Cramping or throbbing pain in your lower abdomen.  Headaches.  Lower back pain.  Nausea or vomiting.  Diarrhea.  Sweating or dizziness.  Loose stools. DIAGNOSIS  A diagnosis is based on your history, symptoms, physical exam, diagnostic tests, or procedures. Diagnostic tests or procedures may include:  Blood tests.  Ultrasonography.  An examination of the lining of the uterus (dilation and curettage, D&C).  An examination inside your abdomen or pelvis with a scope (laparoscopy).  X-rays.  CT scan.  MRI.  An examination inside the bladder with a scope (cystoscopy).  An examination inside the intestine or stomach with a scope (colonoscopy, gastroscopy). TREATMENT  Treatment depends on the cause of the dysmenorrhea. Treatment may include:  Pain medicine prescribed by your health care provider.  Birth control pills or an IUD with progesterone hormone in it.  Hormone replacement therapy.  Nonsteroidal anti-inflammatory drugs (NSAIDs). These may help stop the production of prostaglandins.  Surgery to remove adhesions, endometriosis, ovarian cyst, or fibroids.  Removal of the uterus (hysterectomy).  Progesterone shots to stop the menstrual period.  Cutting the nerves on the sacrum that go to the female organs (presacral neurectomy).  Electric current to the sacral nerves (sacral nerve stimulation).  Antidepressant medicine.  Psychiatric therapy, counseling, or group therapy.  Exercise and physical therapy.  Meditation and yoga therapy.  Acupuncture. HOME CARE INSTRUCTIONS   Only take  over-the-counter or prescription medicines as directed by your health care provider.  Place a heating pad or hot water bottle on your lower back or abdomen. Do not sleep with the heating pad.  Use aerobic exercises, walking, swimming, biking, and other exercises to help lessen the cramping.  Massage to the lower back or abdomen may help.  Stop smoking.  Avoid alcohol and caffeine. SEEK MEDICAL CARE IF:   Your pain does not get better with medicine.  You have pain with sexual intercourse.  Your pain increases and is not controlled with medicines.  You have abnormal vaginal bleeding with your period.  You develop nausea or vomiting with your period that is not controlled with medicine. SEEK IMMEDIATE MEDICAL CARE IF:  You pass out.  Document Released: 01/16/2005 Document Revised: 09/18/2012 Document Reviewed: 07/04/2012 [facility] Patient Information 2015 [place], [place]. This information is not intended to replace advice given to you by your health care provider. Make sure you discuss any questions you have with your health care provider.

## 2014-08-07 ENCOUNTER — Other Ambulatory Visit: Payer: Self-pay | Admitting: *Deleted

## 2014-08-07 ENCOUNTER — Telehealth: Payer: Self-pay | Admitting: *Deleted

## 2014-08-07 DIAGNOSIS — N938 Other specified abnormal uterine and vaginal bleeding: Secondary | ICD-10-CM

## 2014-08-07 NOTE — Telephone Encounter (Signed)
Contacted patient, information given concerning referral and ultrasound appointment.  Ultrasound scheduled for 7/12 @ 11300, Pt verbalizes understanding.

## 2014-08-12 ENCOUNTER — Ambulatory Visit (HOSPITAL_COMMUNITY): Payer: Medicaid Other

## 2014-08-13 ENCOUNTER — Ambulatory Visit (HOSPITAL_COMMUNITY)
Admission: RE | Admit: 2014-08-13 | Discharge: 2014-08-13 | Disposition: A | Discharge status: Home / Self Care | Payer: Medicaid Other | Source: Ambulatory Visit | Attending: Obstetrics & Gynecology | Admitting: Obstetrics & Gynecology

## 2014-08-13 DIAGNOSIS — N938 Other specified abnormal uterine and vaginal bleeding: Secondary | ICD-10-CM

## 2014-08-20 ENCOUNTER — Ambulatory Visit (HOSPITAL_COMMUNITY)
Admission: RE | Admit: 2014-08-20 | Discharge: 2014-08-20 | Disposition: A | Discharge status: Home / Self Care | Payer: Medicaid Other | Source: Ambulatory Visit | Attending: Obstetrics & Gynecology | Admitting: Obstetrics & Gynecology

## 2014-08-20 DIAGNOSIS — N939 Abnormal uterine and vaginal bleeding, unspecified: Secondary | ICD-10-CM | POA: Diagnosis not present

## 2014-08-20 DIAGNOSIS — D251 Intramural leiomyoma of uterus: Secondary | ICD-10-CM | POA: Diagnosis not present

## 2014-08-20 DIAGNOSIS — N926 Irregular menstruation, unspecified: Secondary | ICD-10-CM | POA: Diagnosis present

## 2014-08-20 DIAGNOSIS — N854 Malposition of uterus: Secondary | ICD-10-CM | POA: Insufficient documentation

## 2014-08-26 ENCOUNTER — Encounter: Payer: Medicaid Other | Admitting: Obstetrics & Gynecology

## 2014-09-03 ENCOUNTER — Encounter: Payer: Self-pay | Admitting: Family Medicine

## 2014-09-03 ENCOUNTER — Encounter: Payer: Self-pay | Admitting: Obstetrics and Gynecology

## 2014-09-03 ENCOUNTER — Ambulatory Visit (INDEPENDENT_AMBULATORY_CARE_PROVIDER_SITE_OTHER): Payer: Medicaid Other | Admitting: Obstetrics and Gynecology

## 2014-09-03 VITALS — BP 126/86 | HR 68 | Temp 98.4°F | Ht 68.0 in | Wt 171.5 lb

## 2014-09-03 DIAGNOSIS — N939 Abnormal uterine and vaginal bleeding, unspecified: Secondary | ICD-10-CM | POA: Insufficient documentation

## 2014-09-03 LAB — POCT PREGNANCY, URINE: PREG TEST UR: NEGATIVE

## 2014-09-03 NOTE — Progress Notes (Signed)
   Subjective:    Patient ID: Melissa Moyer, female    DOB: [birth year], 39 y.o.   MRN: [id number]  HPI 61F hx HSV presents for eval of menstrual irregularity.  [id number] nsvd x2, menarche age 63, regular periods every 26 days lasting 5-7 days until last year. Since then every 4 months experiences a full period twice a month. Last menstruated 2 days ago. More dysmenorrhea with these periods than with others. No other spotting. Not getting more frequent. Saw PCP who performed lab work, hasn't received those results. Did get u/s showing a few 1-2 cm fibroids. Last pap ~2014, patient says was normal. No breast discharge. No heavy bleeding.   Review of Systems See above, otherwise negative    Objective:   Physical Exam Filed Vitals:   09/03/14 1607  BP: 126/86  Pulse: 68  Temp: 98.4 F (36.9 C)  NAD No thyromegaly RRR Abdomen soft Refuses GU exam      Assessment & Plan:   # Abnormal uterine bleeding - irregular menstrual bleeding in reproductive age female. - Upreg negative - pelvic u/s reviewed, few intramural fibroids, otherwise unremarkable - patient refused GU exam today - patient says labs were obtained by her by her PCP. They have not arrived here. I proposed repeating labs: TSH, prolactin, and FSH. Holding on androgens as no polycystic ovaries seen on u/s and no signs of hyperandrogenism on exam. Patient adamant that does not want to repeat labs; thus I have asked my nurse to obtain a ROI to attempt to obtain those labs from her pcp, and I told the patient to go to her pcp to tell them to send to [place]  # Prevention - UTD pap

## 2014-09-04 ENCOUNTER — Encounter: Payer: Self-pay | Admitting: *Deleted

## 2014-10-29 ENCOUNTER — Ambulatory Visit: Payer: Medicaid Other | Admitting: Family Medicine

## 2015-04-12 ENCOUNTER — Emergency Department (HOSPITAL_COMMUNITY)
Admission: EM | Admit: 2015-04-12 | Discharge: 2015-04-12 | Disposition: A | Discharge status: Home / Self Care | Payer: Medicaid Other | Attending: Emergency Medicine | Admitting: Emergency Medicine

## 2015-04-12 ENCOUNTER — Encounter (HOSPITAL_COMMUNITY): Payer: Self-pay | Admitting: Family Medicine

## 2015-04-12 ENCOUNTER — Telehealth (HOSPITAL_BASED_OUTPATIENT_CLINIC_OR_DEPARTMENT_OTHER): Payer: Self-pay | Admitting: Emergency Medicine

## 2015-04-12 DIAGNOSIS — M545 Low back pain: Secondary | ICD-10-CM | POA: Diagnosis not present

## 2015-04-12 DIAGNOSIS — J45909 Unspecified asthma, uncomplicated: Secondary | ICD-10-CM | POA: Diagnosis not present

## 2015-04-12 DIAGNOSIS — F17200 Nicotine dependence, unspecified, uncomplicated: Secondary | ICD-10-CM | POA: Insufficient documentation

## 2015-04-12 DIAGNOSIS — R339 Retention of urine, unspecified: Secondary | ICD-10-CM | POA: Diagnosis not present

## 2015-04-12 LAB — URINALYSIS, ROUTINE W REFLEX MICROSCOPIC
Bilirubin Urine: NEGATIVE
Glucose, UA: NEGATIVE mg/dL
KETONES UR: NEGATIVE mg/dL
LEUKOCYTES UA: NEGATIVE
NITRITE: NEGATIVE
PH: 6 (ref 5.0–8.0)
PROTEIN: NEGATIVE mg/dL
Specific Gravity, Urine: 1.025 (ref 1.005–1.030)

## 2015-04-12 LAB — URINE MICROSCOPIC-ADD ON

## 2015-04-12 NOTE — ED Notes (Signed)
Pt here for right lower back pain and urinary retention.

## 2015-04-12 NOTE — ED Notes (Signed)
Unable to locate when called for room x3 

## 2015-04-12 NOTE — ED Notes (Signed)
Patient refused vital signs reassessment.

## 2016-06-25 IMAGING — US US PELVIS COMPLETE
1 series · 13 of 25 positions shown · non-contrast
Comparison: None

CLINICAL DATA: Patient with irregular menstrual cycles. Uterine
cramping. No prior surgeries.



[Series 1: us pelvis complete · 63 acquisitions, 13 frames shown]
[im 1/63]
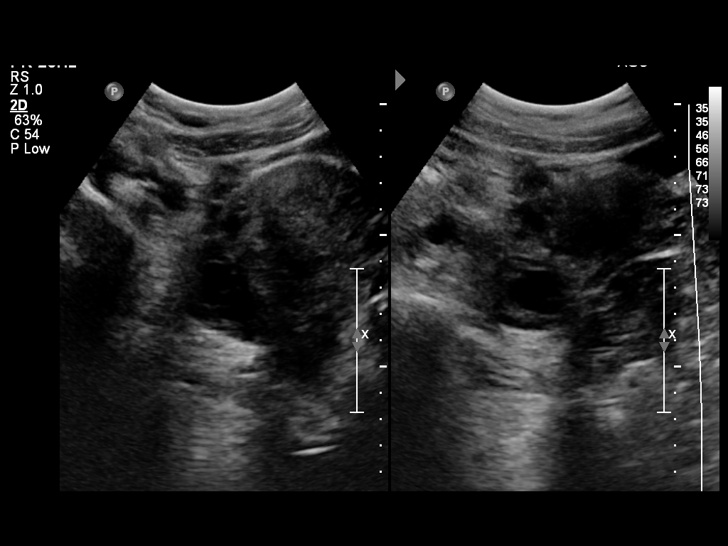
[im 6/63]
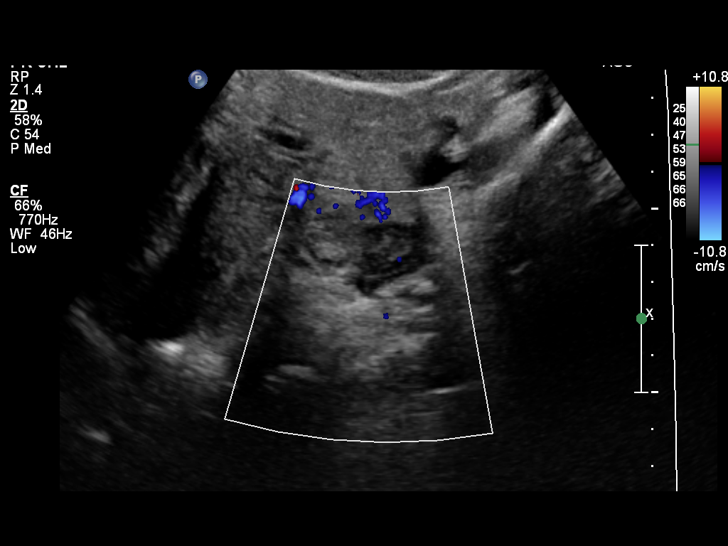
[im 11/63]
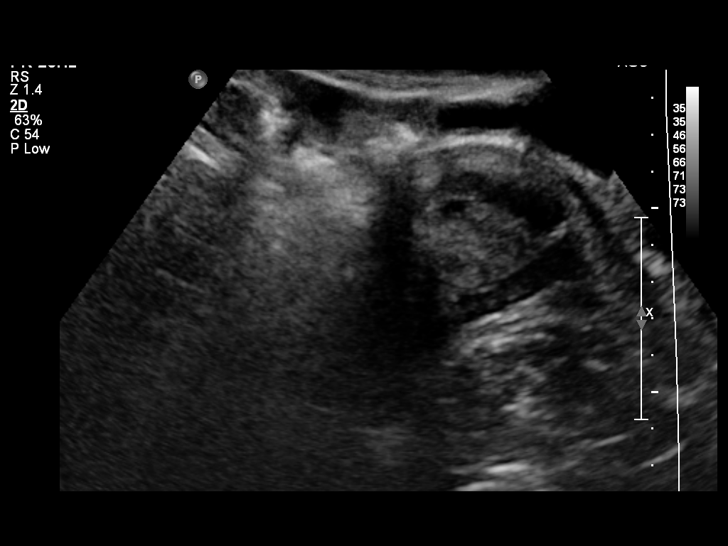
[im 16/63]
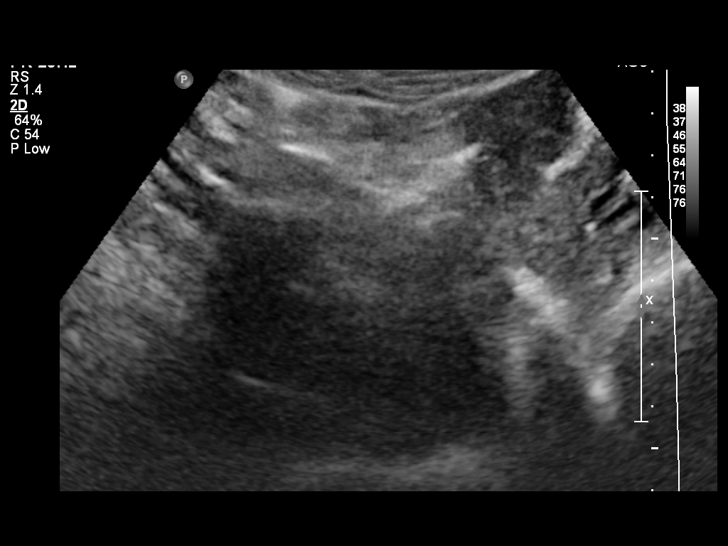
[im 21/63]
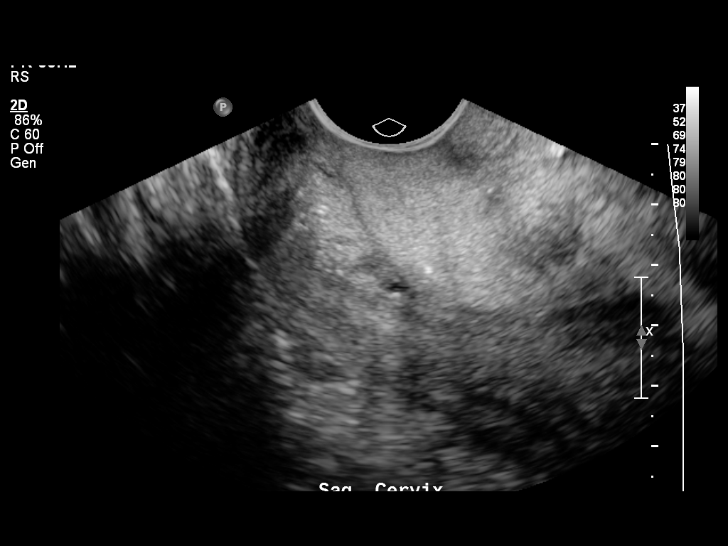
[im 26/63]
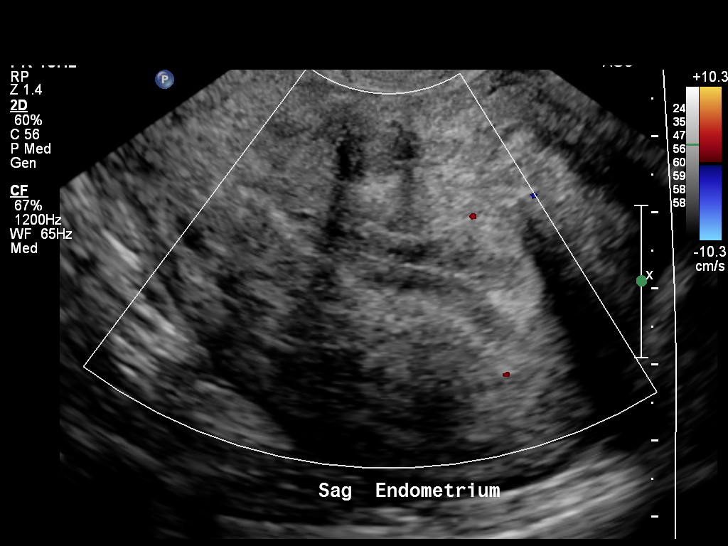
[im 32/63]
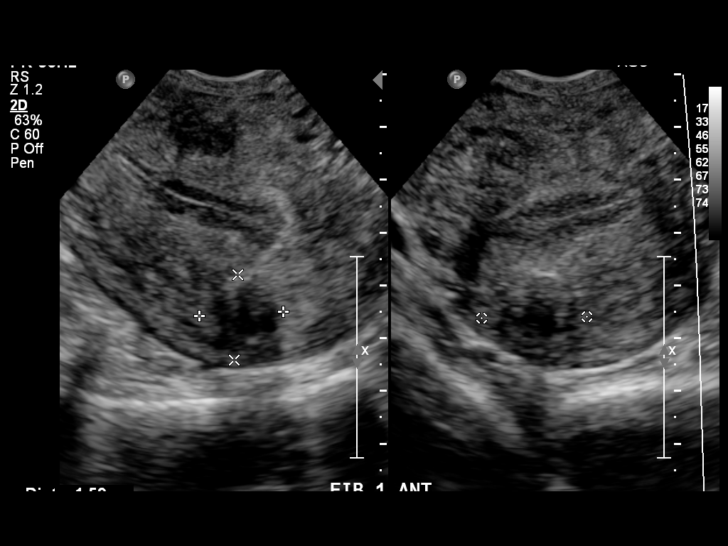
[im 37/63]
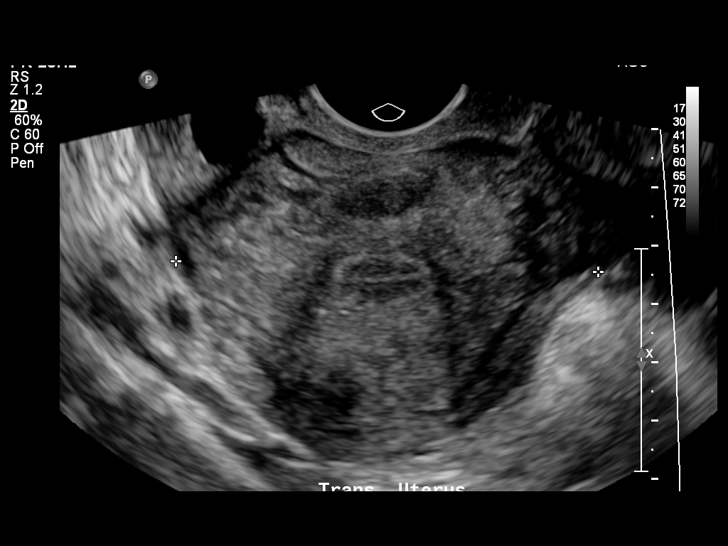
[im 42/63]
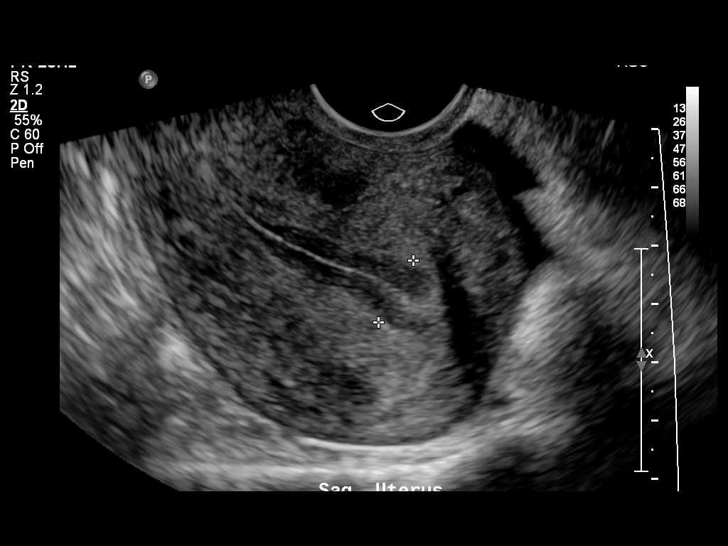
[im 47/63]
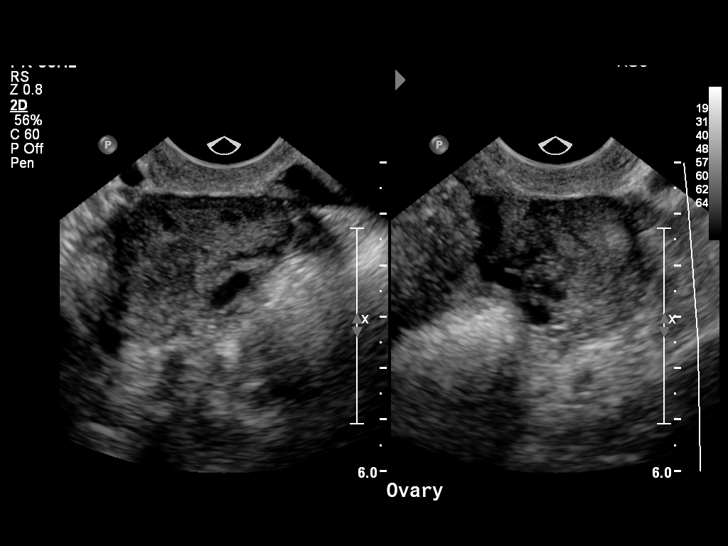
[im 52/63]
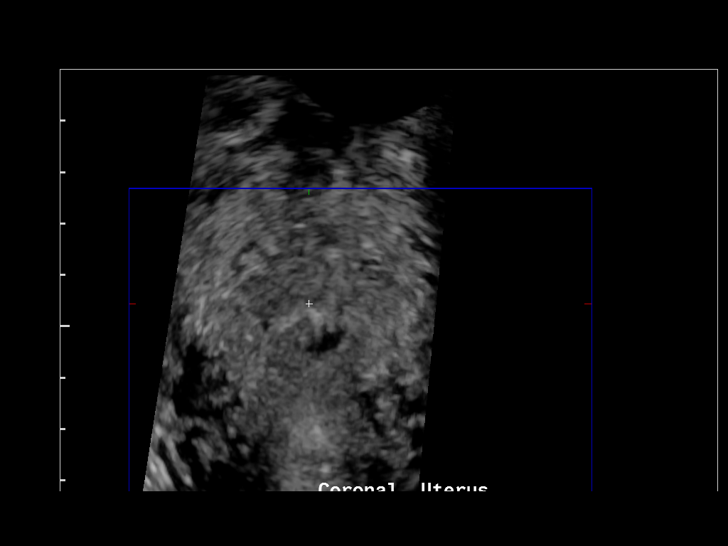
[im 57/63]
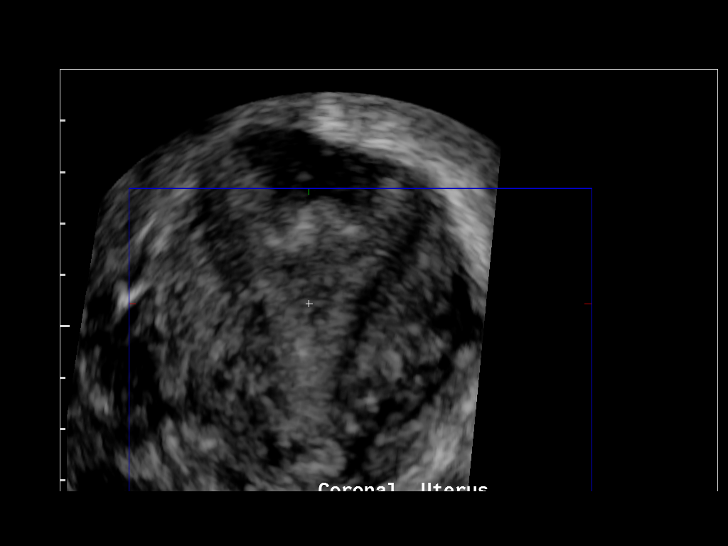
[im 63/63]
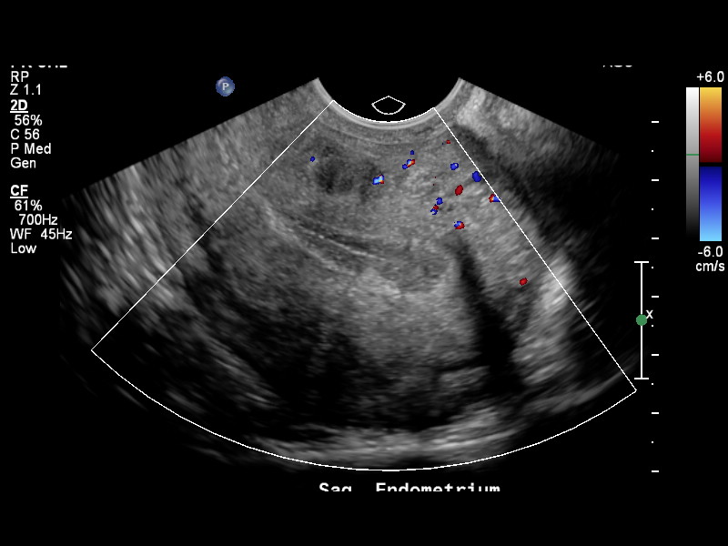

[13 of 25 positions shown; findings below may reference images not displayed]

FINDINGS: Uterus

Measurements: 10.4 x 5.9 x 7.2 cm. Retroverted. Within the anterior
uterine body there is an intramural 1.6 x 1.6 x 2.0 cm fibroid.
Within the posterior uterine body there is a 1.3 x 1.2 x 1.7 cm
intramural fibroid. Small cyst within the uterine myometrium.

Endometrium

Thickness: 13 mm.  No focal abnormality visualized.

Right ovary

Measurements: 4.7 x 3.0 x 2.7 cm. Normal appearance/no adnexal mass.
Prominent follicles.

Left ovary

Measurements: 4.5 x 2.9 x 2.9 cm. Normal appearance/no adnexal mass.

Other findings

No free fluid.
IMPRESSION: Fibroid uterus.

Endometrium measures 13 mm. If bleeding remains unresponsive to
hormonal or medical therapy, sonohysterogram should be considered
for focal lesion work-up. (Ref: Radiological Reasoning: Algorithmic
Workup of Abnormal Vaginal Bleeding with Endovaginal Sonography and
Sonohysterography. AJR [id number]; 191:S68-73)

## 2017-03-27 ENCOUNTER — Other Ambulatory Visit: Payer: Self-pay

## 2017-03-27 ENCOUNTER — Encounter: Payer: Self-pay | Admitting: Emergency Medicine

## 2017-03-27 ENCOUNTER — Ambulatory Visit (INDEPENDENT_AMBULATORY_CARE_PROVIDER_SITE_OTHER): Payer: BLUE CROSS/BLUE SHIELD | Admitting: Emergency Medicine

## 2017-03-27 VITALS — BP 132/86 | HR 63 | Temp 99.1°F | Resp 16 | Ht 67.0 in | Wt 172.0 lb

## 2017-03-27 DIAGNOSIS — S39012A Strain of muscle, fascia and tendon of lower back, initial encounter: Secondary | ICD-10-CM | POA: Diagnosis not present

## 2017-03-27 DIAGNOSIS — Z8619 Personal history of other infectious and parasitic diseases: Secondary | ICD-10-CM

## 2017-03-27 DIAGNOSIS — M7918 Myalgia, other site: Secondary | ICD-10-CM | POA: Diagnosis not present

## 2017-03-27 DIAGNOSIS — M545 Low back pain, unspecified: Secondary | ICD-10-CM | POA: Insufficient documentation

## 2017-03-27 DIAGNOSIS — M62838 Other muscle spasm: Secondary | ICD-10-CM | POA: Insufficient documentation

## 2017-03-27 DIAGNOSIS — Z8719 Personal history of other diseases of the digestive system: Secondary | ICD-10-CM | POA: Diagnosis not present

## 2017-03-27 LAB — POCT URINALYSIS DIP (MANUAL ENTRY)
BILIRUBIN UA: NEGATIVE mg/dL
Bilirubin, UA: NEGATIVE
Glucose, UA: NEGATIVE mg/dL
Leukocytes, UA: NEGATIVE
Nitrite, UA: NEGATIVE
PH UA: 6.5 (ref 5.0–8.0)
PROTEIN UA: NEGATIVE mg/dL
SPEC GRAV UA: 1.015 (ref 1.010–1.025)
UROBILINOGEN UA: 0.2 U/dL

## 2017-03-27 MED ORDER — VALACYCLOVIR HCL 500 MG PO TABS: 500.0000 mg | ORAL_TABLET | Freq: Two times a day (BID) | ORAL | 2 refills | Status: AC

## 2017-03-27 MED ORDER — DICLOFENAC SODIUM 75 MG PO TBEC: 75.0000 mg | DELAYED_RELEASE_TABLET | Freq: Two times a day (BID) | ORAL | 0 refills | Status: AC

## 2017-03-27 MED ORDER — OMEPRAZOLE 40 MG PO CPDR: 40.0000 mg | DELAYED_RELEASE_CAPSULE | Freq: Every day | ORAL | 0 refills | Status: DC

## 2017-03-27 MED ORDER — CYCLOBENZAPRINE HCL 10 MG PO TABS: 10.0000 mg | ORAL_TABLET | Freq: Three times a day (TID) | ORAL | 0 refills | Status: DC | PRN

## 2017-03-27 NOTE — Progress Notes (Addendum)
Melissa Moyer 42 y.o.   Chief Complaint  Patient presents with  . Back Pain    LOWER since yesterday    HISTORY OF PRESENT ILLNESS: This is a 42 y.o. female complaining of low back pain since yesterday.  Denies any significant injury.  Denies urinary symptoms.  Denies neurological symptoms to her legs.  Back Pain  This is a new problem. The current episode started yesterday. The problem occurs constantly. The problem is unchanged. The pain is present in the lumbar spine. The quality of the pain is described as aching. The pain does not radiate. The pain is at a severity of 7/10. The pain is moderate. The pain is the same all the time. The symptoms are aggravated by bending, position and twisting. Pertinent negatives include no abdominal pain, bladder incontinence, bowel incontinence, chest pain, dysuria, fever, headaches, leg pain, numbness, paresis, paresthesias, pelvic pain, perianal numbness, tingling, weakness or weight loss. Risk factors: none. She has tried analgesics (Tylenol) for the symptoms.     Prior to Admission medications   Medication Sig Start Date End Date Taking? Authorizing Provider  omeprazole (PRILOSEC) 40 MG capsule Take 1 capsule (40 mg total) by mouth daily. 08/20/12  Yes Rosana Hoes, MD  VALACYCLOVIR HCL PO Take by mouth.   Yes [provider]    Allergies  Allergen Reactions  . Fish Allergy Hives    Patient Active Problem List   Diagnosis Date Noted  . Abnormal uterine bleeding 09/03/2014  . TRICHOMONIASIS 04/22/2010  . ANEMIA 04/22/2010  . TOBACCO ABUSE 04/22/2010  . MICROSCOPIC HEMATURIA 04/22/2010  . VAGINAL DISCHARGE 04/22/2010  . DIABETES MELLITUS, GESTATIONAL, HX OF 04/22/2010  . URINARY FREQUENCY 11/24/2009  . PERIUMBILICAL HERNIA 08/67/6195  . HERPES, GENITAL NOS 10/15/2006  . ABORTION, BY DILATION AND EVACUATION, HX OF 10/15/2006    Past Medical History:  Diagnosis Date  . Asthma   . Hernia     Past Surgical History:    Procedure Laterality Date  . HERNIA REPAIR      Social History   Socioeconomic History  . Marital status: Single    Spouse name: Not on file  . Number of children: Not on file  . Years of education: Not on file  . Highest education level: Not on file  Social Needs  . Financial resource strain: Not on file  . Food insecurity - worry: Not on file  . Food insecurity - inability: Not on file  . Transportation needs - medical: Not on file  . Transportation needs - non-medical: Not on file  Occupational History  . Not on file  Tobacco Use  . Smoking status: Current Every Day Smoker  . Smokeless tobacco: Never Used  Substance and Sexual Activity  . Alcohol use: Yes  . Drug use: No  . Sexual activity: Yes  Other Topics Concern  . Not on file  Social History Narrative  . Not on file    No family history on file.   Review of Systems  Constitutional: Negative for fever and weight loss.  HENT: Negative.   Eyes: Negative.   Respiratory: Negative.  Negative for cough and shortness of breath.   Cardiovascular: Negative.  Negative for chest pain and palpitations.  Gastrointestinal: Negative for abdominal pain, blood in stool, bowel incontinence, diarrhea, melena, nausea and vomiting.  Genitourinary: Negative for bladder incontinence, dysuria, frequency, hematuria, pelvic pain and urgency.  Musculoskeletal: Positive for back pain. Negative for joint pain and neck pain.  Skin: Negative.  Negative  for rash.  Neurological: Negative.  Negative for tingling, sensory change, focal weakness, weakness, numbness, headaches and paresthesias.  Endo/Heme/Allergies: Negative.   All other systems reviewed and are negative.   Vitals:   03/27/17 0933  BP: 132/86  Pulse: 63  Resp: 16  Temp: 99.1 F (37.3 C)  SpO2: 97%    Physical Exam  Constitutional: She is oriented to person, place, and time. She appears well-developed and well-nourished.  HENT:  Head: Normocephalic and atraumatic.   Right Ear: External ear normal.  Left Ear: External ear normal.  Nose: Nose normal.  Mouth/Throat: Oropharynx is clear and moist.  Eyes: Conjunctivae and EOM are normal. Pupils are equal, round, and reactive to light.  Neck: Normal range of motion. Neck supple. No JVD present.  Cardiovascular: Normal rate, regular rhythm and normal heart sounds.  Pulmonary/Chest: Effort normal and breath sounds normal.  Abdominal: Soft. Bowel sounds are normal. She exhibits no distension. There is no tenderness. There is no guarding.  Musculoskeletal: Normal range of motion. She exhibits no edema.  Lymphadenopathy:    She has no cervical adenopathy.  Neurological: She is alert and oriented to person, place, and time. No sensory deficit. She exhibits normal muscle tone.  Skin: Skin is warm and dry. Capillary refill takes less than 2 seconds. No rash noted.  Psychiatric: She has a normal mood and affect. Her behavior is normal.  Vitals reviewed.  Results for orders placed or performed in visit on 03/27/17 (from the past 24 hour(s))  POCT urinalysis dipstick     Status: Abnormal   Collection Time: 03/27/17 10:04 AM  Result Value Ref Range   Color, UA yellow yellow   Clarity, UA clear clear   Glucose, UA negative negative mg/dL   Bilirubin, UA negative negative   Ketones, POC UA negative negative mg/dL   Spec Grav, UA 1.015 1.010 - 1.025   Blood, UA moderate (A) negative   pH, UA 6.5 5.0 - 8.0   Protein Ur, POC negative negative mg/dL   Urobilinogen, UA 0.2 0.2 or 1.0 E.U./dL   Nitrite, UA Negative Negative   Leukocytes, UA Negative Negative     ASSESSMENT & PLAN: Melissa Moyer was seen today for back pain.  Diagnoses and all orders for this visit:  Acute right-sided low back pain without sciatica -     POCT urinalysis dipstick  Acute myofascial strain of lumbar region, initial encounter  Muscle spasm -     cyclobenzaprine (FLEXERIL) 10 MG tablet; Take 1 tablet (10 mg total) by mouth 3 (three)  times daily as needed for muscle spasms.  Musculoskeletal pain -     diclofenac (VOLTAREN) 75 MG EC tablet; Take 1 tablet (75 mg total) by mouth 2 (two) times daily for 5 days.  History of herpes genitalis -     valACYclovir (VALTREX) 500 MG tablet; Take 1 tablet (500 mg total) by mouth 2 (two) times daily for 7 days.  History of gastroesophageal reflux (GERD) -     omeprazole (PRILOSEC) 40 MG capsule; Take 1 capsule (40 mg total) by mouth daily.    Patient Instructions       IF you received an x-ray today, you will receive an invoice from [facility]. Please contact [facility] at [phone] with questions or concerns regarding your invoice.   IF you received labwork today, you will receive an invoice from [place]. Please contact [facility] at [phone] with questions or concerns regarding your invoice.   Our billing staff will not be  able to assist you with questions regarding bills from these companies.  You will be contacted with the lab results as soon as they are available. The fastest way to get your results is to activate your My Chart account. Instructions are located on the last page of this paperwork. If you have not heard from [place] regarding the results in 2 weeks, please contact this office.     Back Pain, Adult Back pain is very common. The pain often gets better over time. The cause of back pain is usually not dangerous. Most people can learn to manage their back pain on their own. Follow these instructions at home: Watch your back pain for any changes. The following actions may help to lessen any pain you are feeling:  Stay active. Start with short walks on flat ground if you can. Try to walk farther each day.  Exercise regularly as told by your doctor. Exercise helps your back heal faster. It also helps avoid future injury by keeping your muscles strong and flexible.  Do not sit, drive, or stand in one place for more than 30 minutes.  Do  not stay in bed. Resting more than 1-2 days can slow down your recovery.  Be careful when you bend or lift an object. Use good form when lifting: ? Bend at your knees. ? Keep the object close to your body. ? Do not twist.  Sleep on a firm mattress. Lie on your side, and bend your knees. If you lie on your back, put a pillow under your knees.  Take medicines only as told by your doctor.  Put ice on the injured area. ? Put ice in a plastic bag. ? Place a towel between your skin and the bag. ? Leave the ice on for 20 minutes, 2-3 times a day for the first 2-3 days. After that, you can switch between ice and heat packs.  Avoid feeling anxious or stressed. Find good ways to deal with stress, such as exercise.  Maintain a healthy weight. Extra weight puts stress on your back.  Contact a doctor if:  You have pain that does not go away with rest or medicine.  You have worsening pain that goes down into your legs or buttocks.  You have pain that does not get better in one week.  You have pain at night.  You lose weight.  You have a fever or chills. Get help right away if:  You cannot control when you poop (bowel movement) or pee (urinate).  Your arms or legs feel weak.  Your arms or legs lose feeling (numbness).  You feel sick to your stomach (nauseous) or throw up (vomit).  You have belly (abdominal) pain.  You feel like you may pass out (faint). This information is not intended to replace advice given to you by your health care provider. Make sure you discuss any questions you have with your health care provider. Document Released: 07/05/2007 Document Revised: 06/24/2015 Document Reviewed: 05/20/2013 Elsevier Interactive Patient Education  2018 Elsevier Inc.      Agustina Caroli, MD [facility]

## 2017-03-27 NOTE — Patient Instructions (Addendum)
     IF you received an x-ray today, you will receive an invoice from [facility]. Please contact [facility] at [phone] with questions or concerns regarding your invoice.   IF you received labwork today, you will receive an invoice from [facility]. Please contact [facility] at [phone] with questions or concerns regarding your invoice.   Our billing staff will not be able to assist you with questions regarding bills from these companies.  You will be contacted with the lab results as soon as they are available. The fastest way to get your results is to activate your My Chart account. Instructions are located on the last page of this paperwork. If you have not heard from us regarding the results in 2 weeks, please contact this office.      Back Pain, Adult Back pain is very common. The pain often gets better over time. The cause of back pain is usually not dangerous. Most people can learn to manage their back pain on their own. Follow these instructions at home: Watch your back pain for any changes. The following actions may help to lessen any pain you are feeling:  Stay active. Start with short walks on flat ground if you can. Try to walk farther each day.  Exercise regularly as told by your doctor. Exercise helps your back heal faster. It also helps avoid future injury by keeping your muscles strong and flexible.  Do not sit, drive, or stand in one place for more than 30 minutes.  Do not stay in bed. Resting more than 1-2 days can slow down your recovery.  Be careful when you bend or lift an object. Use good form when lifting: ? Bend at your knees. ? Keep the object close to your body. ? Do not twist.  Sleep on a firm mattress. Lie on your side, and bend your knees. If you lie on your back, put a pillow under your knees.  Take medicines only as told by your doctor.  Put ice on the injured area. ? Put ice in a plastic bag. ? Place a towel between your  skin and the bag. ? Leave the ice on for 20 minutes, 2-3 times a day for the first 2-3 days. After that, you can switch between ice and heat packs.  Avoid feeling anxious or stressed. Find good ways to deal with stress, such as exercise.  Maintain a healthy weight. Extra weight puts stress on your back.  Contact a doctor if:  You have pain that does not go away with rest or medicine.  You have worsening pain that goes down into your legs or buttocks.  You have pain that does not get better in one week.  You have pain at night.  You lose weight.  You have a fever or chills. Get help right away if:  You cannot control when you poop (bowel movement) or pee (urinate).  Your arms or legs feel weak.  Your arms or legs lose feeling (numbness).  You feel sick to your stomach (nauseous) or throw up (vomit).  You have belly (abdominal) pain.  You feel like you may pass out (faint). This information is not intended to replace advice given to you by your health care provider. Make sure you discuss any questions you have with your health care provider. Document Released: 07/05/2007 Document Revised: 06/24/2015 Document Reviewed: 05/20/2013 Elsevier Interactive Patient Education  2018 Elsevier Inc.  

## 2017-04-23 ENCOUNTER — Other Ambulatory Visit: Payer: Self-pay | Admitting: Internal Medicine

## 2017-04-23 DIAGNOSIS — M62838 Other muscle spasm: Secondary | ICD-10-CM

## 2017-04-23 NOTE — Telephone Encounter (Signed)
Copied from [place] [id number]. Topic: Quick Communication - Rx Refill/Question >> Apr 23, 2017  3:51 PM Percell Belt A wrote: Medication:cyclobenzaprine (FLEXERIL) 10 MG tablet [[id number]]  Has the patient contacted their pharmacy? no (Agent: If no, request that the patient contact the pharmacy for the refill.) Preferred Pharmacy (with phone number or street name): [facility] on [address]: Please be advised that RX refills may take up to 3 business days. We ask that you follow-up with your pharmacy.

## 2017-05-01 ENCOUNTER — Other Ambulatory Visit: Payer: Self-pay | Admitting: Emergency Medicine

## 2017-05-01 DIAGNOSIS — M62838 Other muscle spasm: Secondary | ICD-10-CM

## 2017-05-01 NOTE — Telephone Encounter (Signed)
LOV:03/27/17  Dr. Kathreen Devoid    [facility]

## 2018-07-12 ENCOUNTER — Encounter: Payer: Self-pay | Admitting: Family Medicine

## 2020-10-13 ENCOUNTER — Telehealth: Payer: Self-pay | Admitting: Emergency Medicine

## 2020-10-13 DIAGNOSIS — R030 Elevated blood-pressure reading, without diagnosis of hypertension: Secondary | ICD-10-CM

## 2020-10-13 MED ORDER — HYDROCHLOROTHIAZIDE 12.5 MG PO TABS: 12.5000 mg | ORAL_TABLET | Freq: Every day | ORAL | 1 refills | Status: AC

## 2020-10-13 NOTE — Progress Notes (Signed)
Virtual Visit Consent   Melissa Moyer, you are scheduled for a virtual visit with a [facility] provider today.     Just as with appointments in the office, your consent must be obtained to participate.  Your consent will be active for this visit and any virtual visit you may have with one of our providers in the next 365 days.     If you have a MyChart account, a copy of this consent can be sent to you electronically.  All virtual visits are billed to your insurance company just like a traditional visit in the office.    As this is a virtual visit, video technology does not allow for your provider to perform a traditional examination.  This may limit your provider's ability to fully assess your condition.  If your provider identifies any concerns that need to be evaluated in person or the need to arrange testing (such as labs, EKG, etc.), we will make arrangements to do so.     Although advances in technology are sophisticated, we cannot ensure that it will always work on either your end or our end.  If the connection with a video visit is poor, the visit may have to be switched to a telephone visit.  With either a video or telephone visit, we are not always able to ensure that we have a secure connection.     I need to obtain your verbal consent now.   Are you willing to proceed with your visit today?    [name] has provided verbal consent on 10/13/2020 for a virtual visit video.   Montine Circle, PA-C   Date: 10/13/2020 11:59 AM   Virtual Visit via Video Note   I, Montine Circle, connected with  Melissa Moyer  ([id number], 1975-02-15) on 10/13/20 at 12:00 PM EDT by a video-enabled telemedicine application and verified that I am speaking with the correct person using two identifiers.  Location: Patient: Virtual Visit Location Patient: Mobile Provider: Virtual Visit Location Provider: Home Office   I discussed the limitations of evaluation and management by telemedicine and the  availability of in person appointments. The patient expressed understanding and agreed to proceed.    History of Present Illness: Melissa Moyer is a 45 y.o. who identifies as a female who was assigned female at birth, and is being seen today for high blood pressure.  States that she is in the 150s/110s.  Has had some intermittent headaches.  Denies any chest pain/SOB.  Denies urinary symptoms.  Doesn't take anything for blood pressure.  Is switching doctors and sees her new PCP in October.  HPI: HPI  Problems:  Patient Active Problem List   Diagnosis Date Noted   Acute right-sided low back pain without sciatica 03/27/2017   Acute lumbar myofascial strain 03/27/2017   Muscle spasm 03/27/2017   Musculoskeletal pain 03/27/2017   History of herpes genitalis 03/27/2017   History of gastroesophageal reflux (GERD) 03/27/2017   Abnormal uterine bleeding 09/03/2014   TRICHOMONIASIS 04/22/2010   ANEMIA 04/22/2010   TOBACCO ABUSE 04/22/2010   MICROSCOPIC HEMATURIA 04/22/2010   VAGINAL DISCHARGE 04/22/2010   DIABETES MELLITUS, GESTATIONAL, HX OF 04/22/2010   URINARY FREQUENCY [id number]   PERIUMBILICAL HERNIA [id number]   HERPES, GENITAL NOS 10/15/2006   ABORTION, BY DILATION AND EVACUATION, HX OF 10/15/2006    Allergies:  Allergies  Allergen Reactions   Fish Allergy Hives   Medications:  Current Outpatient Medications:    cyclobenzaprine (FLEXERIL) 10 MG tablet, Take 1 tablet (10  mg total) by mouth 3 (three) times daily as needed for muscle spasms., Disp: 30 tablet, Rfl: 0   omeprazole (PRILOSEC) 40 MG capsule, Take 1 capsule (40 mg total) by mouth daily., Disp: 30 capsule, Rfl: 0  Observations/Objective: Patient is well-developed, well-nourished in no acute distress.  Resting comfortably in her car.  Head is normocephalic, atraumatic.  No labored breathing.  Speech is clear and coherent with logical content.  Patient is alert and oriented at baseline.    Assessment and Plan: 1.  Elevated blood pressure reading  PCP follow-up in 1 month for formal workup.  Will start 12.'5mg'$  HCTZ in the mean time.  Go to ER for chest pain, dizziness, SOB, or other symptoms that you find concerning.  Follow Up Instructions: I discussed the assessment and treatment plan with the patient. The patient was provided an opportunity to ask questions and all were answered. The patient agreed with the plan and demonstrated an understanding of the instructions.  A copy of instructions were sent to the patient via MyChart.  The patient was advised to call back or seek an in-person evaluation if the symptoms worsen or if the condition fails to improve as anticipated.  Time:  I spent 11 minutes with the patient via telehealth technology discussing the above problems/concerns.    Montine Circle, PA-C

## 2022-10-06 ENCOUNTER — Other Ambulatory Visit (HOSPITAL_COMMUNITY): Payer: Medicaid Other

## 2022-10-06 ENCOUNTER — Ambulatory Visit (INDEPENDENT_AMBULATORY_CARE_PROVIDER_SITE_OTHER): Payer: Medicaid Other

## 2022-10-06 ENCOUNTER — Encounter (HOSPITAL_COMMUNITY): Payer: Self-pay | Admitting: Emergency Medicine

## 2022-10-06 ENCOUNTER — Ambulatory Visit (HOSPITAL_COMMUNITY)
Admission: EM | Admit: 2022-10-06 | Discharge: 2022-10-06 | Disposition: A | Discharge status: Home / Self Care | Payer: Medicaid Other | Attending: Family Medicine | Admitting: Family Medicine

## 2022-10-06 DIAGNOSIS — M545 Low back pain, unspecified: Secondary | ICD-10-CM

## 2022-10-06 DIAGNOSIS — M25522 Pain in left elbow: Secondary | ICD-10-CM

## 2022-10-06 DIAGNOSIS — M549 Dorsalgia, unspecified: Secondary | ICD-10-CM | POA: Diagnosis not present

## 2022-10-06 HISTORY — DX: Essential (primary) hypertension: I10

## 2022-10-06 MED ORDER — TIZANIDINE HCL 4 MG PO TABS: 4.0000 mg | ORAL_TABLET | Freq: Three times a day (TID) | ORAL | 0 refills | Status: DC | PRN
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
## 2023-01-05 ENCOUNTER — Ambulatory Visit (HOSPITAL_COMMUNITY)
Admission: EM | Admit: 2023-01-05 | Discharge: 2023-01-05 | Disposition: A | Discharge status: Home / Self Care | Payer: Medicaid Other | Attending: Family Medicine | Admitting: Family Medicine

## 2023-01-05 ENCOUNTER — Encounter (HOSPITAL_COMMUNITY): Payer: Self-pay | Admitting: *Deleted

## 2023-01-05 DIAGNOSIS — R21 Rash and other nonspecific skin eruption: Secondary | ICD-10-CM

## 2023-01-05 MED ORDER — VALACYCLOVIR HCL 1 G PO TABS: 1000.0000 mg | ORAL_TABLET | Freq: Three times a day (TID) | ORAL | 0 refills | Status: AC

## 2023-01-05 NOTE — Discharge Instructions (Addendum)
You were seen today for a rash to the face.  Although this does not look like shingles, we will treat for this just in case.  Please start valtrex three times/day x 7 days.  You may continue to use over the counter topicals as well.  If you start to have eye pain, redness or discharge then please return for re-evaluation.
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
## 2023-01-05 NOTE — ED Triage Notes (Signed)
Pt states she thinks she has shingles in her left eye. There is a rash on her left outer eye x 3 days. She put witch hazel on the area, she also had a old rx for valtrex so she took part of one yesterday.She is also applying neosporin on the area.

## 2023-03-07 ENCOUNTER — Encounter (HOSPITAL_COMMUNITY): Payer: Self-pay | Admitting: *Deleted

## 2023-03-07 ENCOUNTER — Emergency Department (HOSPITAL_COMMUNITY): Payer: Medicaid Other

## 2023-03-07 ENCOUNTER — Other Ambulatory Visit: Payer: Self-pay

## 2023-03-07 ENCOUNTER — Emergency Department (HOSPITAL_COMMUNITY)
Admission: EM | Admit: 2023-03-07 | Discharge: 2023-03-08 | Disposition: A | Discharge status: Home / Self Care | Payer: Medicaid Other | Attending: Emergency Medicine | Admitting: Emergency Medicine

## 2023-03-07 DIAGNOSIS — E876 Hypokalemia: Secondary | ICD-10-CM | POA: Insufficient documentation

## 2023-03-07 DIAGNOSIS — R109 Unspecified abdominal pain: Secondary | ICD-10-CM

## 2023-03-07 DIAGNOSIS — D72829 Elevated white blood cell count, unspecified: Secondary | ICD-10-CM | POA: Insufficient documentation

## 2023-03-07 DIAGNOSIS — I10 Essential (primary) hypertension: Secondary | ICD-10-CM | POA: Insufficient documentation

## 2023-03-07 DIAGNOSIS — R1030 Lower abdominal pain, unspecified: Secondary | ICD-10-CM | POA: Insufficient documentation

## 2023-03-07 DIAGNOSIS — R1010 Upper abdominal pain, unspecified: Secondary | ICD-10-CM | POA: Diagnosis present

## 2023-03-07 DIAGNOSIS — J45909 Unspecified asthma, uncomplicated: Secondary | ICD-10-CM | POA: Insufficient documentation

## 2023-03-07 LAB — URINALYSIS, ROUTINE W REFLEX MICROSCOPIC
Bilirubin Urine: NEGATIVE
Glucose, UA: NEGATIVE mg/dL
Ketones, ur: NEGATIVE mg/dL
Nitrite: NEGATIVE
Protein, ur: NEGATIVE mg/dL
Specific Gravity, Urine: 1.014 (ref 1.005–1.030)
pH: 6 (ref 5.0–8.0)

## 2023-03-07 LAB — COMPREHENSIVE METABOLIC PANEL
ALT: 17 U/L (ref 0–44)
AST: 18 U/L (ref 15–41)
Albumin: 3.4 g/dL — ABNORMAL LOW (ref 3.5–5.0)
Alkaline Phosphatase: 69 U/L (ref 38–126)
Anion gap: 9 (ref 5–15)
BUN: 12 mg/dL (ref 6–20)
CO2: 26 mmol/L (ref 22–32)
Calcium: 9.1 mg/dL (ref 8.9–10.3)
Chloride: 104 mmol/L (ref 98–111)
Creatinine, Ser: 0.72 mg/dL (ref 0.44–1.00)
GFR, Estimated: >60 mL/min (ref >60)
Glucose, Bld: 108 mg/dL — ABNORMAL HIGH (ref 70–99)
Potassium: 3.1 mmol/L — ABNORMAL LOW (ref 3.5–5.1)
Sodium: 139 mmol/L (ref 135–145)
Total Bilirubin: 0.5 mg/dL (ref 0.0–1.2)
Total Protein: 7 g/dL (ref 6.5–8.1)

## 2023-03-07 LAB — CBC
HCT: 40.1 % (ref 36.0–46.0)
Hemoglobin: 13.6 g/dL (ref 12.0–15.0)
MCH: 31 pg (ref 26.0–34.0)
MCHC: 33.9 g/dL (ref 30.0–36.0)
MCV: 91.3 fL (ref 80.0–100.0)
Platelets: 287 10*3/uL (ref 150–400)
RBC: 4.39 MIL/uL (ref 3.87–5.11)
RDW: 14.1 % (ref 11.5–15.5)
WBC: 12.9 10*3/uL — ABNORMAL HIGH (ref 4.0–10.5)
nRBC: 0 % (ref 0.0–0.2)

## 2023-03-07 LAB — LIPASE, BLOOD: Lipase: 29 U/L (ref 11–51)

## 2023-03-07 LAB — HCG, SERUM, QUALITATIVE: Preg, Serum: NEGATIVE

## 2023-03-07 MED ORDER — OXYCODONE-ACETAMINOPHEN 5-325 MG PO TABS
1.0000 | ORAL_TABLET | Freq: Once | ORAL | Status: AC
  Administered 2023-03-07: 1 via ORAL
  Filled 2023-03-07: qty 1

## 2023-03-07 NOTE — ED Triage Notes (Signed)
 The pt has had lt lower abd pain for 2 days  no n v or diarrhea  maybe a temp  no urinary symptoms lmp aug

## 2023-03-07 NOTE — ED Provider Triage Note (Signed)
 Emergency Medicine Provider Triage Evaluation Note  Melissa Moyer , a 48 y.o. female  was evaluated in triage.  Pt complains of abdominal pain, with no nausea, vomiting, diarrhea for 2 days.  She reports worse after eating.  No previous history of gallstones, issues with her pancreas.  She denies alcohol, drug use.  No previous history of pancreatitis..  Review of Systems  Positive: Abdominal pain Negative:   Physical Exam  BP (!) 121/99 (BP Location: Left Arm)   Pulse 88   Temp 97.9 F (36.6 C) (Oral)   Resp 18   Ht 5' 7 (1.702 m)   Wt 78 kg   LMP  (LMP Unknown)   SpO2 100%   BMI 26.93 kg/m  Gen:   Awake, no distress   Resp:  Normal effort  MSK:   Moves extremities without difficulty  Other:  Focal tender in the epigastric region, radiating to the right upper quadrant.  Medical Decision Making  Medically screening exam initiated at 9:58 PM.  Appropriate orders placed.  Melissa Moyer was informed that the remainder of the evaluation will be completed by another provider, this initial triage assessment does not replace that evaluation, and the importance of remaining in the ED until their evaluation is complete.  Workup initiated in triage    Melissa Moyer 03/07/23 2159

## 2023-03-08 ENCOUNTER — Emergency Department (HOSPITAL_COMMUNITY): Payer: Medicaid Other

## 2023-03-08 MED ORDER — NAPROXEN 375 MG PO TABS: 375.0000 mg | ORAL_TABLET | Freq: Two times a day (BID) | ORAL | 0 refills | Status: AC

## 2023-03-08 MED ORDER — IOHEXOL 350 MG/ML SOLN
75.0000 mL | Freq: Once | INTRAVENOUS | Status: AC | PRN
  Administered 2023-03-08: 75 mL via INTRAVENOUS

## 2023-03-08 MED ORDER — POTASSIUM CHLORIDE CRYS ER 20 MEQ PO TBCR
40.0000 meq | EXTENDED_RELEASE_TABLET | Freq: Once | ORAL | Status: AC
  Administered 2023-03-08: 40 meq via ORAL
  Filled 2023-03-08: qty 2

## 2023-03-08 MED ORDER — PANTOPRAZOLE SODIUM 40 MG PO TBEC: 40.0000 mg | DELAYED_RELEASE_TABLET | Freq: Every day | ORAL | 0 refills | Status: AC

## 2023-03-08 NOTE — ED Notes (Signed)
 Called pt x 3 no answer

## 2023-03-08 NOTE — Discharge Instructions (Signed)
 The test today in the ED did not show any signs of any serious problems.  Take the medications to see if it helps with your symptoms.  Follow-up with your doctor to be rechecked next week if the symptoms have not resolved.

## 2023-03-08 NOTE — ED Provider Notes (Signed)
 [facility] Provider Note   CSN: [id number] Arrival date & time: 03/07/23  2111     History  Chief Complaint  Patient presents with   Abdominal Pain    Melissa Moyer is a 48 y.o. female.   Abdominal Pain    Patient has a history of asthma hernia and hypertension.  She presents to the ED with complaints of abdominal pain that started a couple days ago.  Patient states pain primarily was in her lower abdomen but she also feels it in her upper abdomen.  She denies any urinary symptoms.  No problems with any vomiting or diarrhea.  Patient states when she pushes on it she feels it more in the upper abdomen.  Home Medications Prior to Admission medications   Medication Sig Start Date End Date Taking? Authorizing Provider  Aspirin-Acetaminophen -Caffeine (GOODYS EXTRA STRENGTH) 500-325-65 MG PACK Take 1 packet by mouth daily as needed (migraine).   Yes [provider]  hydrochlorothiazide  (HYDRODIURIL ) 12.5 MG tablet Take 1 tablet (12.5 mg total) by mouth daily. 10/13/20  Yes Vicky Charleston, PA-C  ibuprofen (ADVIL) 200 MG tablet Take 400 mg by mouth every 8 (eight) hours as needed for mild pain (pain score 1-3) or moderate pain (pain score 4-6).   Yes [provider]  lidocaine (LIDODERM) 5 % Place 1 patch onto the skin daily. Remove & Discard patch within 12 hours or as directed by MD   Yes [provider]  Multiple Vitamins-Minerals (MULTIVITAMIN WITH MINERALS) tablet Take 1 tablet by mouth daily.   Yes [provider]  naproxen  (NAPROSYN ) 375 MG tablet Take 1 tablet (375 mg total) by mouth 2 (two) times daily. 03/08/23  Yes Randol Simmonds, MD  pantoprazole  (PROTONIX ) 40 MG tablet Take 1 tablet (40 mg total) by mouth daily. 03/08/23  Yes Randol Simmonds, MD      Allergies    Fish allergy    Review of Systems   Review of Systems  Gastrointestinal:  Positive for abdominal pain.    Physical Exam Updated Vital  Signs BP (!) 118/92   Pulse 69   Temp 97.7 F (36.5 C)   Resp 18   Ht 1.702 m (5' 7)   Wt 78 kg   LMP  (LMP Unknown)   SpO2 99%   BMI 26.93 kg/m  Physical Exam Vitals and nursing note reviewed.  Constitutional:      General: She is not in acute distress.    Appearance: She is well-developed.  HENT:     Head: Normocephalic and atraumatic.     Right Ear: External ear normal.     Left Ear: External ear normal.  Eyes:     General: No scleral icterus.       Right eye: No discharge.        Left eye: No discharge.     Conjunctiva/sclera: Conjunctivae normal.  Neck:     Trachea: No tracheal deviation.  Cardiovascular:     Rate and Rhythm: Normal rate and regular rhythm.  Pulmonary:     Effort: Pulmonary effort is normal. No respiratory distress.     Breath sounds: Normal breath sounds. No stridor. No wheezing or rales.  Abdominal:     General: Bowel sounds are normal. There is no distension.     Palpations: Abdomen is soft.     Tenderness: There is generalized abdominal tenderness. There is guarding. There is no rebound.     Comments: Tenderness in the lower  abdomen as well as the upper abdomen,  Musculoskeletal:        General: No tenderness or deformity.     Cervical back: Neck supple.  Skin:    General: Skin is warm and dry.     Findings: No rash.  Neurological:     General: No focal deficit present.     Mental Status: She is alert.     Cranial Nerves: No cranial nerve deficit, dysarthria or facial asymmetry.     Sensory: No sensory deficit.     Motor: No abnormal muscle tone or seizure activity.     Coordination: Coordination normal.  Psychiatric:        Mood and Affect: Mood normal.     ED Results / Procedures / Treatments   Labs (all labs ordered are listed, but only abnormal results are displayed) Labs Reviewed  COMPREHENSIVE METABOLIC PANEL - Abnormal; Notable for the following components:      Result Value   Potassium 3.1 (*)    Glucose, Bld 108 (*)     Albumin 3.4 (*)    All other components within normal limits  CBC - Abnormal; Notable for the following components:   WBC 12.9 (*)    All other components within normal limits  URINALYSIS, ROUTINE W REFLEX MICROSCOPIC - Abnormal; Notable for the following components:   Hgb urine dipstick SMALL (*)    Leukocytes,Ua TRACE (*)    Bacteria, UA RARE (*)    All other components within normal limits  LIPASE, BLOOD  HCG, SERUM, QUALITATIVE    EKG None  Radiology CT ABDOMEN PELVIS W CONTRAST Result Date: 03/08/2023 CLINICAL DATA:  Diverticulitis, complication suspected EXAM: CT ABDOMEN AND PELVIS WITH CONTRAST TECHNIQUE: Multidetector CT imaging of the abdomen and pelvis was performed using the standard protocol following bolus administration of intravenous contrast. RADIATION DOSE REDUCTION: This exam was performed according to the departmental dose-optimization program which includes automated exposure control, adjustment of the mA and/or kV according to patient size and/or use of iterative reconstruction technique. CONTRAST:  75mL OMNIPAQUE  IOHEXOL  350 MG/ML SOLN COMPARISON:  US  Abdomen, 03/07/2023. FINDINGS: Lower chest: No acute abnormality. Hepatobiliary: Normal volume liver smooth contour. Sub-1 cm RIGHT hepatic lobe hypodensity, too small to adequately characterize though likely a small cyst versus image. No gallstones, gallbladder wall thickening, or biliary dilatation. Pancreas: No pancreatic ductal dilatation or surrounding inflammatory changes. Spleen: Normal in size without focal abnormality. Adrenals/Urinary Tract: Mild thickening of the medial limb of the LEFT adrenal gland without discrete nodule. The RIGHT adrenal gland is unremarkable. Kidneys are normal, without renal calculi, focal lesion, or hydronephrosis. Bladder is unremarkable. Stomach/Bowel: Stomach is within normal limits. Appendix appears normal. No evidence of bowel wall thickening, distention, or inflammatory changes.  Vascular/Lymphatic: Aortic atherosclerosis. No enlarged abdominal or pelvic lymph nodes. Reproductive: Uterus and adnexa are unremarkable. Other: No abdominal wall hernia or abnormality. No abdominopelvic ascites. Musculoskeletal: Mild degenerative changes of imaged spine, greatest at L5-S1 with disc space change. No acute osseous findings. IMPRESSION: 1. No acute abdominopelvic process. 2. Aortic Atherosclerosis (ICD10-I70.0). Additional incidental, chronic and senescent findings as above. Electronically Signed   By: Thom Hall M.D.   On: 03/08/2023 09:51   US  Abdomen Limited RUQ (LIVER/GB) Result Date: 03/07/2023 CLINICAL DATA:  Right upper quadrant pain EXAM: ULTRASOUND ABDOMEN LIMITED RIGHT UPPER QUADRANT COMPARISON:  None Available. FINDINGS: Gallbladder: No gallstones or wall thickening visualized. No sonographic Murphy sign noted by sonographer. Common bile duct: Diameter: 1.6 mm Liver: No focal lesion  identified. Within normal limits in parenchymal echogenicity. Portal vein is patent on color Doppler imaging with normal direction of blood flow towards the liver. Other: None. IMPRESSION: Normal right upper quadrant ultrasound. Electronically Signed   By: Greig Pique M.D.   On: 03/07/2023 22:51    Procedures Procedures    Medications Ordered in ED Medications  potassium chloride  SA (KLOR-CON  M) CR tablet 40 mEq (has no administration in time range)  oxyCODONE -acetaminophen  (PERCOCET/ROXICET) 5-325 MG per tablet 1 tablet (1 tablet Oral Given 03/07/23 2222)  iohexol  (OMNIPAQUE ) 350 MG/ML injection 75 mL (75 mLs Intravenous Contrast Given 03/08/23 0930)    ED Course/ Medical Decision Making/ A&P Clinical Course as of 03/08/23 1021  Thu Mar 08, 2023  0728 Metabolic panel shows potassium decreased to 3.1.  Lipase normal.  Pregnancy test negative.  White blood cell count elevated at 12.9.  Urinalysis normal [JK]  0728 US  Abdomen Limited RUQ (LIVER/GB) Ultrasound does not show any acute findings  [JK]  0736 Notable tenderness on exam.  Initial labs and ultrasound unremarkable.  With her significant tenderness and guarding we will proceed with CT scan for further evaluation [JK]  1008 CT scan abdomen pelvis without acute abdominal pelvic process [JK]    Clinical Course User Index [JK] Randol Simmonds, MD                                 Medical Decision Making Differential diagnosis includes but not limited to hepatitis pancreatitis cholecystitis appendicitis pericolic cholecystitis  Problems Addressed: Abdominal pain, unspecified abdominal location: acute illness or injury that poses a threat to life or bodily functions  Amount and/or Complexity of Data Reviewed Radiology: ordered. Decision-making details documented in ED Course.  Risk Prescription drug management.   Patient presented to the ED for evaluation of abdominal pain.  Patient initially described pain is in her lower abdomen although more focally tender in the upper abdomen.  Labs showed mild hypokalemia but I do not think this is related to her symptoms.  White blood cell count slightly elevated.  No signs of hepatitis or pancreatitis.  Urinalysis not suggestive of infection.  Ultrasound does not show any signs of gallstones or cholecystitis.  CT abdomen pelvis was also performed and it does not show any evidence of appendicitis diverticulitis pancreatitis or other acute abdominal pelvic etiology.  It is possible this pain could be musculoskeletal in nature.  Lower likelihood this is related to gastritis or peptic ulcer disease.  Will try her on a course of NSAIDs as well as antacids.  Discussed outpatient follow-up with PCP to be rechecked.        Final Clinical Impression(s) / ED Diagnoses Final diagnoses:  Abdominal pain, unspecified abdominal location    Rx / DC Orders ED Discharge Orders          Ordered    naproxen  (NAPROSYN ) 375 MG tablet  2 times daily        03/08/23 1018    pantoprazole  (PROTONIX ) 40 MG  tablet  Daily        03/08/23 1018              Randol Simmonds, MD 03/08/23 1021
